# Patient Record
Sex: Male | Born: 1970 | Hispanic: Yes | Marital: Married | Smoking: Former smoker
Health system: Southern US, Community
[De-identification: ages and names within clinical notes are randomized; demographics above are authoritative.]

## PROBLEM LIST (undated history)

## (undated) DIAGNOSIS — E119 Type 2 diabetes mellitus without complications: Secondary | ICD-10-CM

## (undated) DIAGNOSIS — N289 Disorder of kidney and ureter, unspecified: Secondary | ICD-10-CM

## (undated) DIAGNOSIS — R7303 Prediabetes: Secondary | ICD-10-CM

## (undated) DIAGNOSIS — I10 Essential (primary) hypertension: Secondary | ICD-10-CM

## (undated) HISTORY — PX: HAND SURGERY: SHX662

## (undated) HISTORY — DX: Prediabetes: R73.03

## (undated) HISTORY — PX: TONSILECTOMY, ADENOIDECTOMY, BILATERAL MYRINGOTOMY AND TUBES: SHX2538

## (undated) HISTORY — PX: APPENDECTOMY: SHX54

## (undated) HISTORY — DX: Essential (primary) hypertension: I10

---

## 2009-04-19 ENCOUNTER — Inpatient Hospital Stay: Payer: Self-pay | Admitting: Surgery

## 2012-04-22 LAB — URINALYSIS, COMPLETE
Bilirubin,UR: NEGATIVE
Leukocyte Esterase: NEGATIVE
Ph: 7 (ref 4.5–8.0)
Specific Gravity: 1.009 (ref 1.003–1.030)
Squamous Epithelial: NONE SEEN

## 2012-04-22 LAB — COMPREHENSIVE METABOLIC PANEL
Albumin: 3.4 g/dL (ref 3.4–5.0)
Alkaline Phosphatase: 81 U/L (ref 50–136)
Anion Gap: 6 — ABNORMAL LOW (ref 7–16)
Bilirubin,Total: 0.3 mg/dL (ref 0.2–1.0)
Calcium, Total: 9 mg/dL (ref 8.5–10.1)
Chloride: 106 mmol/L (ref 98–107)
Co2: 31 mmol/L (ref 21–32)
Creatinine: 0.88 mg/dL (ref 0.60–1.30)
EGFR (African American): >60
Glucose: 91 mg/dL (ref 65–99)
Osmolality: 284 (ref 275–301)
Potassium: 3.7 mmol/L (ref 3.5–5.1)
Sodium: 143 mmol/L (ref 136–145)

## 2012-04-22 LAB — TSH: Thyroid Stimulating Horm: 1.82 u[IU]/mL

## 2012-04-22 LAB — TROPONIN I: Troponin-I: 0.06 ng/mL — ABNORMAL HIGH

## 2012-04-22 LAB — DRUG SCREEN, URINE
Amphetamines, Ur Screen: NEGATIVE (ref ?–1000)
Barbiturates, Ur Screen: NEGATIVE (ref ?–200)
Cannabinoid 50 Ng, Ur ~~LOC~~: NEGATIVE (ref ?–50)
Cocaine Metabolite,Ur ~~LOC~~: NEGATIVE (ref ?–300)
Methadone, Ur Screen: NEGATIVE (ref ?–300)
Opiate, Ur Screen: NEGATIVE (ref ?–300)
Phencyclidine (PCP) Ur S: NEGATIVE (ref ?–25)
Tricyclic, Ur Screen: NEGATIVE (ref ?–1000)

## 2012-04-22 LAB — CBC
HCT: 49.1 % (ref 40.0–52.0)
MCHC: 33.8 g/dL (ref 32.0–36.0)
RBC: 5.87 10*6/uL (ref 4.40–5.90)
RDW: 14.1 % (ref 11.5–14.5)
WBC: 12.1 10*3/uL — ABNORMAL HIGH (ref 3.8–10.6)

## 2012-04-22 LAB — PRO B NATRIURETIC PEPTIDE: B-Type Natriuretic Peptide: 76 pg/mL (ref 0–125)

## 2012-04-23 ENCOUNTER — Inpatient Hospital Stay: Payer: Self-pay | Admitting: Specialist

## 2012-04-23 LAB — CK TOTAL AND CKMB (NOT AT ARMC)
CK, Total: 5266 U/L — ABNORMAL HIGH (ref 35–232)
CK, Total: 3772 U/L — ABNORMAL HIGH (ref 35–232)

## 2012-04-23 LAB — TROPONIN I
Troponin-I: 0.06 ng/mL — ABNORMAL HIGH
Troponin-I: 0.07 ng/mL — ABNORMAL HIGH

## 2013-03-22 ENCOUNTER — Ambulatory Visit: Payer: Self-pay | Admitting: *Deleted

## 2013-03-25 ENCOUNTER — Ambulatory Visit (INDEPENDENT_AMBULATORY_CARE_PROVIDER_SITE_OTHER): Payer: PRIVATE HEALTH INSURANCE | Admitting: General Surgery

## 2013-03-25 ENCOUNTER — Encounter: Payer: Self-pay | Admitting: General Surgery

## 2013-03-25 VITALS — BP 136/94 | HR 78 | Resp 16 | Ht 67.0 in | Wt 291.0 lb

## 2013-03-25 DIAGNOSIS — N63 Unspecified lump in unspecified breast: Secondary | ICD-10-CM

## 2013-03-25 DIAGNOSIS — N6320 Unspecified lump in the left breast, unspecified quadrant: Secondary | ICD-10-CM

## 2013-03-25 NOTE — Progress Notes (Signed)
Patient ID: Benjamin Moran, male   DOB: 12-19-1970, 42 y.o.   MRN: [id number]  Chief Complaint  Patient presents with  . Breast Problem    HPI Benjamin Moran is a 42 y.o. male.  who presents for a breast evaluation. States left breast pain for about 1 month. States he can feel " a ball" for about 3 months. Denies any specific breast trauma or injury but he does lift boxes at his job.  Interpreter [name] present at visit. HPI  Past Medical History  Diagnosis Date  . Borderline diabetic   . Hypertension     Past Surgical History  Procedure Laterality Date  . Appendectomy      Family History  Problem Relation Age of Onset  . Breast cancer Cousin     Social History History  Substance Use Topics  . Smoking status: Former Smoker    Quit date: 08/19/2006  . Smokeless tobacco: Not on file  . Alcohol Use: No    Allergies  Allergen Reactions  . Penicillins     Current Outpatient Prescriptions  Medication Sig Dispense Refill  . HYDROCHLOROTHIAZIDE PO Take 12.5 mg by mouth daily.       Marland Kitchen LISINOPRIL PO Take 2.5 mg by mouth daily.        No current facility-administered medications for this visit.    Review of Systems Review of Systems  Constitutional: Negative.   Respiratory: Negative.   Cardiovascular: Negative.     Blood pressure 136/94, pulse 78, resp. rate 16, height 5\' 7"  (1.702 m), weight 291 lb (131.997 kg).  Physical Exam Physical Exam  Constitutional: He is oriented to person, place, and time. He appears well-developed and well-nourished.  Eyes: Conjunctivae are normal.  Neck: Neck supple. No thyromegaly present.  Cardiovascular: Normal rate and regular rhythm.   Pulmonary/Chest: Effort normal and breath sounds normal. Right breast exhibits no inverted nipple, no mass, no nipple discharge, no skin change and no tenderness. Left breast exhibits tenderness (lateral aspect). Left breast exhibits no inverted nipple, no mass, no nipple discharge and no  skin change.  Lymphadenopathy:    He has no cervical adenopathy.    He has no axillary adenopathy.  Neurological: He is alert and oriented to person, place, and time.  Skin: Skin is warm and dry.    Data Reviewed Mammogram and ultrasound reviewed.  Assessment    Likely a benign process such as fat necrosis or gynecomastia.  Feel it can be followed at this time.    Plan    Follow up with a left breast office ultrasound in one month.       SANKAR,SEEPLAPUTHUR G 03/25/2013, 7:08 PM

## 2013-03-25 NOTE — Patient Instructions (Addendum)
Autoexamen de mamas   (Breast Self-Awareness)  El autoexamen de mama le permite advertir problemas en la mama con anticipación, cuando todavía son pequeños. Haga un autoexamen de las mamas:   · Todos los meses, 5 a 7 días después de tener el período (menstruación).  · En la misma altura de cada mes si ya no tiene más su período.  Busque:  · Diferencias entre los senos (tamaño, forma o posición).  · Cambios en el tamaño o forma del seno.  · Sale sangre o líquido por los pezones.  · Modificaciones en los pezones (hoyuelos, movimiento del pezón).  ·  Cambio en el color o la textura de la piel (enrojecimiento, áreas escamosas).  Trate de palpar:   · Engrosamientos.  · Bultos.  · Hoyuelos.  · Cualquier otro cambio.  CÓMO REALIZAR EL AUTOEXAMEN DE MAMAS   Observe sus senos y pezones.   1. Quítese toda la ropa por encima de la cintura.  2. Párese frente a un espejo en una habitación con buena iluminación.  3.  Ponga las manos en las caderas y empuje las manos hacia abajo.  Palpe las mamas.   1. Acuéstese boca arriba o de pie en la ducha o la bañera. Si está en la ducha o bañera, moje y enjabone sus manos.  2. Coloque el brazo derecho por encima de la cabeza.  3. Coloque la mano izquierda en el área de la axila derecha.  4. Haga pequeños círculos con las yemas de los dedos (no las puntas) de los 3 dedos medios. Presione ligeramente y luego haga una presión mediana y firme.  5. Mueva los dedos un poco más bajo y haga pequeños círculos en 3 presiones (ligera, media y firme).  6. Siga moviendo los dedos hacia abajo y haciendo círculos hasta llegar a la parte inferior de la mama.  7. Mueva sus dedos de a un dedo de ancho hacia el centro de su cuerpo.  8. Continúe haciendo círculos, esta vez hacia arriba hasta llegar a la parte inferior del cuello.  9. Mueva sus dedos de a un dedo de ancho hacia el centro de su cuerpo.  10. Haga círculos hacia abajo a partir de la parte inferior del cuello. Haga círculos hacia arriba a partir de  la parte inferior de la mama. Deténgase cuando llegue a la mitad del pecho.  11.  Repita estos pasos en la otra mama.  Escriba lo que observe y se sienta normal para cada mama. También anote cualquier cambio que note.   SOLICITE AYUDA DE INMEDIATO SI:   · Observa cambios en las mamas o los pezones.  · Observa cambios en la piel.  · Tiene una secreción anormal en los pezones.  · Usted tiene un bulto nuevo.  · Tiene zonas con endurecimientos inusuales.  Document Released: 09/07/2010 Document Revised: 07/22/2012  [employer]® Patient Information ©2014 [employer].

## 2013-04-22 ENCOUNTER — Ambulatory Visit: Payer: PRIVATE HEALTH INSURANCE | Admitting: General Surgery

## 2013-04-29 ENCOUNTER — Encounter: Payer: Self-pay | Admitting: *Deleted

## 2013-07-22 ENCOUNTER — Ambulatory Visit (INDEPENDENT_AMBULATORY_CARE_PROVIDER_SITE_OTHER): Payer: PRIVATE HEALTH INSURANCE | Admitting: General Surgery

## 2013-07-22 ENCOUNTER — Encounter: Payer: Self-pay | Admitting: General Surgery

## 2013-07-22 ENCOUNTER — Ambulatory Visit: Payer: PRIVATE HEALTH INSURANCE

## 2013-07-22 VITALS — BP 150/72 | HR 78 | Resp 18 | Ht 67.0 in | Wt 291.0 lb

## 2013-07-22 DIAGNOSIS — N6320 Unspecified lump in the left breast, unspecified quadrant: Secondary | ICD-10-CM

## 2013-07-22 DIAGNOSIS — N63 Unspecified lump in unspecified breast: Secondary | ICD-10-CM

## 2013-07-22 NOTE — Progress Notes (Signed)
Patient ID: Benjamin Moran, male   DOB: 07/21/1971, 42 y.o.   MRN: [id number]  Chief Complaint  Patient presents with  . Follow-up    left breast ultrasound    HPI Benjamin Moran is a 42 y.o. male here today for a left breast ultrasound. Patient states still feels the lump in the left breast but the pain is less than before on a scale of 1-10 the pain is a 2.   HPI  Past Medical History  Diagnosis Date  . Borderline diabetic   . Hypertension     Past Surgical History  Procedure Laterality Date  . Appendectomy      Family History  Problem Relation Age of Onset  . Breast cancer Cousin     Social History History  Substance Use Topics  . Smoking status: Former Smoker    Quit date: 08/19/2006  . Smokeless tobacco: Never Used  . Alcohol Use: No    Allergies  Allergen Reactions  . Penicillins     Current Outpatient Prescriptions  Medication Sig Dispense Refill  . HYDROCHLOROTHIAZIDE PO Take 12.5 mg by mouth daily.       Marland Kitchen LISINOPRIL PO Take 2.5 mg by mouth daily.        No current facility-administered medications for this visit.    Review of Systems Review of Systems  Constitutional: Negative.   Respiratory: Negative.   Cardiovascular: Negative.     Blood pressure 150/72, pulse 78, resp. rate 18, height 5\' 7"  (1.702 m), weight 291 lb (131.997 kg).  Physical Exam Physical Exam  Constitutional: He is oriented to person, place, and time. He appears well-developed and well-nourished.  Eyes: No scleral icterus.  Pulmonary/Chest: Right breast exhibits no inverted nipple, no mass, no nipple discharge, no skin change and no tenderness. Left breast exhibits no inverted nipple, no nipple discharge, no skin change and no tenderness.  Lymphadenopathy:    He has no cervical adenopathy.    He has no axillary adenopathy.  Neurological: He is alert and oriented to person, place, and time.  Skin: Skin is warm and dry.    Data Reviewed Reviewed last  ultrasound  Assessment left breast ultrasound today shows some mildly dilated ducts at 3 o'cl left subareoalar,the area measuring 1.55 cm-smaller than it was before.    Likely gynecomastia, improving. Pt advised.  Plan   Patient to return in three months for recheck     Interpreter present for interview , exam and discussion   Vaani Morren G 07/22/2013, 10:23 AM

## 2013-07-22 NOTE — Patient Instructions (Addendum)
Patient to return in three months. Call for any problems in the interval.

## 2013-10-28 ENCOUNTER — Encounter: Payer: Self-pay | Admitting: General Surgery

## 2013-10-28 ENCOUNTER — Ambulatory Visit (INDEPENDENT_AMBULATORY_CARE_PROVIDER_SITE_OTHER): Payer: PRIVATE HEALTH INSURANCE | Admitting: General Surgery

## 2013-10-28 VITALS — BP 150/100 | HR 74 | Resp 16 | Ht 67.0 in | Wt 296.0 lb

## 2013-10-28 DIAGNOSIS — N62 Hypertrophy of breast: Secondary | ICD-10-CM

## 2013-10-28 NOTE — Patient Instructions (Addendum)
Patient to return as needed. Call office for any new breast issues or concerns.

## 2013-10-28 NOTE — Progress Notes (Signed)
Patient ID: Benjamin Moran, male   DOB: 03/20/1971, 43 y.o.   MRN: [id number]  Chief Complaint  Patient presents with  . Follow-up    gynecomastia    HPI Benjamin Moran is a 43 y.o. male here today following with gynecomastia. Patient states he is doing good no pain in left breast area. HPI  Past Medical History  Diagnosis Date  . Borderline diabetic   . Hypertension     Past Surgical History  Procedure Laterality Date  . Appendectomy      Family History  Problem Relation Age of Onset  . Breast cancer Cousin     Social History History  Substance Use Topics  . Smoking status: Former Smoker    Quit date: 08/19/2006  . Smokeless tobacco: Never Used  . Alcohol Use: No    Allergies  Allergen Reactions  . Penicillins     No current outpatient prescriptions on file.   No current facility-administered medications for this visit.    Review of Systems Review of Systems  Constitutional: Negative.   Respiratory: Negative.   Cardiovascular: Negative.     Blood pressure 150/100, pulse 74, resp. rate 16, height 5\' 7"  (1.702 m), weight 296 lb (134.265 kg).  Physical Exam Physical Exam  Constitutional: He is oriented to person, place, and time. He appears well-developed and well-nourished.  Eyes: Conjunctivae are normal.  Neck: Neck supple.  Pulmonary/Chest: Right breast exhibits no inverted nipple, no mass, no nipple discharge, no skin change and no tenderness. Left breast exhibits no inverted nipple, no mass, no nipple discharge, no skin change and no tenderness.  Lymphadenopathy:    He has no cervical adenopathy.    He has no axillary adenopathy.  Neurological: He is alert and oriented to person, place, and time.  Skin: Skin is warm and dry.    Data Reviewed None  Assessment   Gynecomastia resolved     Plan    Patient to return as needed.         SANKAR,SEEPLAPUTHUR G 10/28/2013, 10:54 AM

## 2013-12-24 DIAGNOSIS — S62109A Fracture of unspecified carpal bone, unspecified wrist, initial encounter for closed fracture: Secondary | ICD-10-CM | POA: Insufficient documentation

## 2014-01-07 DIAGNOSIS — M25539 Pain in unspecified wrist: Secondary | ICD-10-CM | POA: Insufficient documentation

## 2014-02-28 DIAGNOSIS — S63599A Other specified sprain of unspecified wrist, initial encounter: Secondary | ICD-10-CM | POA: Insufficient documentation

## 2014-12-06 NOTE — Discharge Summary (Signed)
PATIENT NAME:  Benjamin LionsCRUZ CORTES, Demari MR#:  [id number] DATE OF BIRTH:  1971-07-03  DATE OF ADMISSION:  04/23/2012 DATE OF DISCHARGE:  04/23/2012  For a detailed note, please see the history and physical done by Dr. Tilda FrancoAkuneme on admission.   DIAGNOSES AT DISCHARGE:  1. Hypertension, newly diagnosed.  2. Shortness of breath due to congestive heart failure.  3. Congestive heart failure, likely diastolic dysfunction.  4. Obesity.   DIET: The patient is being discharged on a low sodium diet.   ACTIVITY: As tolerated.   FOLLOW-UP: [facility] and also with Dr. Arnoldo HookerBruce Kowalski in the next 1 to 2 weeks.    DISCHARGE MEDICATIONS:  1. Coreg 6.25 mg b.i.d.  2. Lisinopril 10 mg daily.  3. Lasix 20 mg daily.  4. Potassium 10 mEq daily.   CONSULTANT DURING THE HOSPITAL COURSE: Dr. Arnoldo HookerBruce Kowalski from Cardiology    PERTINENT STUDIES DONE DURING THE HOSPITAL COURSE:  1. Chest x-ray done on admission showing no acute cardiopulmonary disease.  2. An ultrasound of the abdomen showing gallstones without evidence of acute cholecystitis. 3. A two-dimensional echocardiogram done showing normal LV function.  4. A treadmill exercise stress test done showing no evidence of any acute cardiac ischemia. No EKG changes.   HOSPITAL COURSE: This is a 44 year old male who presented to the hospital with progressive shortness of breath and chest pressure.  1. Chest pain/pressure. The patient did have slightly elevated troponin at 0.06. They were cycled but they did not trend upwards. He had no further episodes of chest pain or pressure. As per Cardiology, the troponin elevation was probably related to demand ischemia from his congestive heart failure and unlikely acute coronary syndrome. He did have an echocardiogram done which showed normal LV function.  2. Shortness of breath. This was likely secondary to congestive heart failure. The patient did have uncontrolled hypertension and was not  taking any meds. He did have significant lower extremity edema, therefore, this is likely diastolic dysfunction secondary to his uncontrolled hypertension. He was diuresed with Lasix. His clinical symptoms have improved. He is being discharged on p.o. diuretics along with beta-blockers and ACE inhibitors as stated. He will have close follow-up with Cardiology as outpatient.  3. Hypertension. As mentioned, the patient did not have any previous history of hypertension prior to coming in. His blood pressures were definitely uncontrolled. Currently he is being discharged on Coreg and lisinopril as stated.   CODE STATUS: The patient is a FULL CODE.   TIME SPENT: 40 minutes.   ____________________________ Rolly PancakeVivek J. Cherlynn KaiserSainani, MD vjs:drc D: 04/23/2012 16:49:49 ET T: 04/24/2012 13:56:23 ET JOB#: [id number]  cc: Rolly PancakeVivek J. Cherlynn KaiserSainani, MD, <Dictator> [facility] Lamar BlinksBruce J. Kowalski, MD [place] SirenVIVEK J SAINANI MD ELECTRONICALLY SIGNED 04/27/2012 13:24

## 2014-12-06 NOTE — H&P (Signed)
PATIENT NAME:  Benjamin Moran, Benjamin Moran MR#:  [id number] DATE OF BIRTH:  08-Jan-1971  DATE OF ADMISSION:  04/23/2012  PRIMARY CARE PHYSICIAN: None. ER PHYSICIAN: Dr. Cyril LoosenKinner ADMITTING PHYSICIAN: Dr. Tilda FrancoAkuneme    PRESENTING COMPLAINT: Shortness of breath and chest pain over the last two weeks.   HISTORY: Patient is Spanish speaking only and an interpreter, Rande Lawmanrwin, was used for history and physical. Patient is a 44 year old male who was in his usual state of health until about two weeks ago when he started having progressive shortness of breath. This was initially exertional with intermittent chest pain. Denies any fever. No loss of consciousness. Admits to PND, orthopnea, pedal edema, decreased appetite, increased satiety, increased weight gain. Family stated he has gained about 25 pounds in the last one or two months. Admits to increased urinary frequency but no polyuria or dysuria. With increasing fatigue presented to the Emergency Room today after episode of chest pain this evening. Pain was dull in nature like heaviness on his chest, intensity 8/10, associated weakness and dizziness for this he presented to the Emergency Room, received nitroglycerin and aspirin and had symptoms resolved. Workup so far included a chest x-ray which showed elevated hemidiaphragm, pulmonary vascular congestion. Gallbladder ultrasound which showed gallstones and fatty liver but no biliary duct obstruction. Elevated troponin of 0.06 and elevated blood pressure with systolic 192 on arrival. For this he was referred to the hospitalist for further evaluation. Patient denies any history of long distance travel, sick contact. No change in medication, has not been on any medication in the last three years. No prior medical history of concern.    REVIEW OF SYSTEMS: CONSTITUTIONAL: Positive for fatigue, weakness, and weight gain. EYES: Positive for blurred vision. No cataracts or discharge. No redness. ENT: No tinnitus, epistaxis, or  difficulty swallowing. RESPIRATORY: Positive for cough which is nonproductive and shortness of breath. CARDIOVASCULAR: Positive for chest pain which is now resolved. Intermittent palpitations, exertional dyspnea, PND, orthopnea, pedal edema. GASTROINTESTINAL: Positive for increased satiety but no nausea, vomiting, diarrhea, or change in bowel habits. GENITOURINARY: Positive for frequency but no incontinence, hematuria, or dysuria. ENDOCRINE: Denies any heat or cold intolerance, excessive thirst or increased sweating. HEMATOLOGIC: No anemia, easy bruising, bleeding, or swollen glands. SKIN: No rashes, change in hair or skin texture. MUSCULOSKELETAL: No joint pain, redness, or limited activity. NEURO: No numbness, tremors, vertigo, seizures, memory loss. PSYCH: No anxiety, depression, nervousness.   PAST MEDICAL HISTORY: Nil of note.   PAST SURGICAL HISTORY: Appendectomy about two years ago.   SOCIAL HISTORY: He is married, lives at home with his wife and children. No alcohol, tobacco, or recreational drug use. Works as a Estate agentforklift operator.   FAMILY HISTORY: Positive for cholecystitis. Denies any family history of diabetes, hypertension, strokes or coronary artery disease.   ALLERGIES: Penicillin gives him rash and occasional shortness of breath.   MEDICATIONS: None.   PHYSICAL EXAMINATION:  VITAL SIGNS: Temperature 98.2, pulse 84, respiratory rate 18, blood pressure on arrival 156/77 with peak of 192/84 here in the Emergency Room, currently is down to 152/76, oxygen saturation 96 on room air.   GENERAL: Spanish speaking male overweight, lying on the gurney, awake, alert, oriented to time, place, and person, no family at bedside, supportive.   HEENT: Atraumatic, normocephalic. Pupils equal, reactive to light and accommodation. Mucous membranes pink, moist.   NECK: Supple. No JV distention.   CHEST: Good air entry. Few transmitted breath sounds. No rhonchi, no rales.   HEART: Regular rate and  rhythm. No murmurs.   ABDOMEN: Obese, pendulous, moves with respiration, nontender. Bowel sounds normoactive. No organomegaly.   EXTREMITIES: Bilateral pitting leg edema 2+.   NEUROLOGICAL: No focal motor or sensory deficits. Affect appropriate to situation.   LABORATORY, DIAGNOSTIC AND RADIOLOGICAL DATA: EKG showed normal sinus rhythm, left atrial enlargement, heart rate 79. No acute ST wave changes. Chest x-ray showed elevated hemidiaphragm, pulmonary vascular congestion but no infiltrate. Gallbladder ultrasound showed gallstones but no biliary duct obstruction or that patient had fatty liver.   CBC: White count 12, hemoglobin 16, platelets 313. No prior labs for comparison. Chemistry unremarkable. Sodium 143, potassium 3.7, creatinine 0.8, glucose 91, calcium 9, anion gap 6, AST elevated at 169. Troponin is elevated at 0.06. TSH 1.8. Urinalysis is negative.   IMPRESSION:  1. Chest pain, to rule out ACS versus arrhythmias, also possibly secondary to elevated blood pressure.  2. Elevated blood pressure with no prior history of hypertension, likely accelerated hypertension.  3. Leukocytosis, not otherwise specified.  4. Gallstone, not otherwise specified.   PLAN:  1. Admit to telemetry floor. Check PT- INR, BNP, serial cardiac enzymes, fasting lipid profile, magnesium.  2. Trend WBC. At this time will avoid cultures and antibiotics as no obvious signs of infection.  3. Telemonitoring. Echocardiogram in a.m.  4. Aspirin, p.r.n. nitroglycerin and morphine. Lovenox weight-based dose. Cardiology evaluation in a.m. Daily weight. IV diuresis with Lasix, low dose beta blocker, Coreg 6.25 mg. Will introduce ACE inhibitor later once blood pressure tolerated beta blocker.  5. GI prophylaxis with Protonix. Deep vein thrombosis prophylaxis with Lovenox.  6. CODE STATUS: FULL CODE.  7. Stress test prior to discharge or within the next six weeks.   TOTAL PATIENT CARE TIME: 50 minutes.    ____________________________ Floy Sabina Tilda Franco, MD mia:cms D: 04/23/2012 00:26:44 ET T: 04/23/2012 07:29:07 ET  JOB#: [id number] cc: Antionetta Ator I. Tilda Franco, MD, <Dictator> Margaret Pyle MD ELECTRONICALLY SIGNED 04/24/2012 0:51

## 2014-12-06 NOTE — Consult Note (Signed)
PATIENT NAME:  Benjamin Moran, Benjamin Moran MR#:  [id number] DATE OF BIRTH:  07/11/1971  DATE OF CONSULTATION:  04/23/2012  REFERRING PHYSICIAN:  Dr. Tilda Franco  CONSULTING PHYSICIAN:  Lamar Blinks, MD  REASON FOR CONSULTATION: Chest discomfort, tobacco abuse with borderline hypertension, hyperlipidemia needing further treatment options.   CHIEF COMPLAINT: "I had chest pain."   HISTORY OF PRESENT ILLNESS: This is a 44 year old male with known borderline hypertension, hyperlipidemia with tobacco abuse who has had new onset of waxing and waning substernal chest discomfort and burning and pressure. This is occurring in the last several days with accumulation of lower extremity edema and shortness of breath consistent with possible congestive heart failure. The patient was seen in the Emergency Room with an EKG showing normal sinus rhythm with nonspecific ST changes. Troponin, CK-MB are within normal limits and the patient has had some slight improvement of symptoms over the last several hours. The patient has had no evidence of nausea or vomiting at this time.   REVIEW OF SYSTEMS: Remainder review of systems negative for vision change, ringing in the ears, hearing loss, cough, congestion, heartburn, nausea, vomiting, diarrhea, bloody stools, stomach pain, extremity pain, leg weakness, cramping of the buttocks, known blood clots, headaches, blackouts, dizzy spells, nosebleeds, congestion, trouble swallowing, frequent urination, urination at night, muscle weakness, numbness, anxiety, depression, skin lesions, skin rashes.   PAST MEDICAL HISTORY:  1. Borderline hypertension.  2. Borderline hyperlipidemia.   FAMILY HISTORY: No family members with early onset of cardiovascular disease or hypertension.   SOCIAL HISTORY: He currently denies alcohol use, occasionally having tobacco use.  ALLERGIES: He has no apparent known allergies.   CURRENT MEDICATIONS: As listed have.   PHYSICAL EXAMINATION:  VITAL  SIGNS: Blood pressure 136/68 bilaterally, heart rate 72 upright, reclining, and regular.   GENERAL: He is a well appearing male in no acute distress.   HEENT: No icterus, thyromegaly, ulcers, hemorrhage, or xanthelasma.   CARDIOVASCULAR: Regular rate and rhythm. Normal S1 and S2. Diffuse PMI. No apparent murmur, gallop, or rub. Carotid upstroke normal without bruit. Jugular venous pressure normal.   LUNGS: Lungs have few basilar crackles with normal respirations.   ABDOMEN: Soft, nontender without hepatosplenomegaly or masses. Abdominal aorta is normal size without bruit.   EXTREMITIES: 2+ bilateral pulses in dorsal, pedal, radial, and femoral arteries 1+ lower extremity edema. No cyanosis, clubbing, ulcers.   NEUROLOGIC: He is oriented to time, place, and person with normal mood and affect.   ASSESSMENT: 44 year old male with acute onset of chest burning, shortness of breath consistent with possible congestive heart failure with no current evidence of myocardial infarction with abnormal EKG needing further treatment options.   RECOMMENDATIONS:  1. Continue serial ECG and enzymes to assess for possible extent of myocardial infarction.  2. Echocardiogram for LV systolic dysfunction, valvular heart disease contributing to current symptoms and possible heart failure.  3. Follow hypertension and hyperlipidemia and further treatment options with possible ACE inhibitor versus beta blocker depending on echocardiogram.        4. Consideration of cardiac catheterization versus stress test to assess for possible myocardial ischemia, coronary artery disease contributing to above. Patient understands the risks and benefits of cardiac catheterization. These include the possibly of death, stroke, heart attack, infection, bleeding, blood clot. He is at low risk for conscious sedation.   ____________________________ Lamar Blinks, MD bjk:cms D: 04/23/2012 05:33:44 ET T: 04/23/2012 10:26:13  ET JOB#: [id number]  cc: Lamar Blinks, MD, <Dictator> Lamar Blinks MD ELECTRONICALLY  SIGNED 04/24/2012 7:50

## 2019-11-05 ENCOUNTER — Ambulatory Visit: Payer: Self-pay | Attending: Internal Medicine

## 2019-11-05 DIAGNOSIS — Z23 Encounter for immunization: Secondary | ICD-10-CM

## 2019-11-05 NOTE — Progress Notes (Signed)
   Covid-19 Vaccination Clinic  Name:  Benjamin Moran    MRN: [id number] DOB: 1971/07/22  11/05/2019  [name] was observed post Covid-19 immunization for 15 minutes without incident. He was provided with Vaccine Information Sheet and instruction to access the V-Safe system.   [name] was instructed to call 911 with any severe reactions post vaccine: Marland Kitchen Difficulty breathing  . Swelling of face and throat  . A fast heartbeat  . A bad rash all over body  . Dizziness and weakness   Immunizations Administered    Name Date Dose VIS Date Route   Pfizer COVID-19 Vaccine 11/05/2019 12:33 PM 0.3 mL 07/30/2019 Intramuscular   Manufacturer: [employer], [employer]   Lot: [id number]   NDC: [address]-0

## 2019-11-26 ENCOUNTER — Ambulatory Visit: Payer: Self-pay | Attending: Internal Medicine

## 2019-11-26 DIAGNOSIS — Z23 Encounter for immunization: Secondary | ICD-10-CM

## 2019-11-26 NOTE — Progress Notes (Signed)
   Covid-19 Vaccination Clinic  Name:  Benjamin Moran    MRN: [id number] DOB: December 04, 1970  11/26/2019  [name] was observed post Covid-19 immunization for 15 minutes without incident. He was provided with Vaccine Information Sheet and instruction to access the V-Safe system.   [name] was instructed to call 911 with any severe reactions post vaccine: Marland Kitchen Difficulty breathing  . Swelling of face and throat  . A fast heartbeat  . A bad rash all over body  . Dizziness and weakness   Immunizations Administered    Name Date Dose VIS Date Route   Pfizer COVID-19 Vaccine 11/26/2019 12:30 PM 0.3 mL 07/30/2019 Intramuscular   Manufacturer: [employer], [employer]   Lot: [id number]   NDC: [address]-9

## 2020-05-27 ENCOUNTER — Encounter: Payer: Self-pay | Admitting: Emergency Medicine

## 2020-05-27 ENCOUNTER — Other Ambulatory Visit: Payer: Self-pay

## 2020-05-27 ENCOUNTER — Emergency Department: Payer: Self-pay

## 2020-05-27 ENCOUNTER — Emergency Department
Admission: EM | Admit: 2020-05-27 | Discharge: 2020-06-03 | Disposition: A | Discharge status: Home / Self Care | Payer: Self-pay | Attending: Emergency Medicine | Admitting: Emergency Medicine

## 2020-05-27 DIAGNOSIS — R2243 Localized swelling, mass and lump, lower limb, bilateral: Secondary | ICD-10-CM | POA: Insufficient documentation

## 2020-05-27 DIAGNOSIS — E119 Type 2 diabetes mellitus without complications: Secondary | ICD-10-CM | POA: Insufficient documentation

## 2020-05-27 DIAGNOSIS — I10 Essential (primary) hypertension: Secondary | ICD-10-CM | POA: Insufficient documentation

## 2020-05-27 DIAGNOSIS — Z87891 Personal history of nicotine dependence: Secondary | ICD-10-CM | POA: Insufficient documentation

## 2020-05-27 DIAGNOSIS — R609 Edema, unspecified: Secondary | ICD-10-CM

## 2020-05-27 HISTORY — DX: Type 2 diabetes mellitus without complications: E11.9

## 2020-05-27 LAB — CBC
HCT: 49.1 % (ref 39.0–52.0)
Hemoglobin: 15.6 g/dL (ref 13.0–17.0)
MCH: 27.4 pg (ref 26.0–34.0)
MCHC: 31.8 g/dL (ref 30.0–36.0)
MCV: 86.1 fL (ref 80.0–100.0)
Platelets: 357 10*3/uL (ref 150–400)
RBC: 5.7 MIL/uL (ref 4.22–5.81)
RDW: 13.9 % (ref 11.5–15.5)
WBC: 12.9 10*3/uL — ABNORMAL HIGH (ref 4.0–10.5)
nRBC: 0 % (ref 0.0–0.2)

## 2020-05-27 LAB — BASIC METABOLIC PANEL
Anion gap: 10 (ref 5–15)
BUN: 15 mg/dL (ref 6–20)
CO2: 33 mmol/L — ABNORMAL HIGH (ref 22–32)
Calcium: 8.9 mg/dL (ref 8.9–10.3)
Chloride: 92 mmol/L — ABNORMAL LOW (ref 98–111)
Creatinine, Ser: 0.99 mg/dL (ref 0.61–1.24)
GFR, Estimated: >60 mL/min (ref >60)
Glucose, Bld: 114 mg/dL — ABNORMAL HIGH (ref 70–99)
Potassium: 4.4 mmol/L (ref 3.5–5.1)
Sodium: 135 mmol/L (ref 135–145)

## 2020-05-27 LAB — BRAIN NATRIURETIC PEPTIDE: B Natriuretic Peptide: 54.3 pg/mL (ref 0.0–100.0)

## 2020-05-27 LAB — TROPONIN I (HIGH SENSITIVITY): Troponin I (High Sensitivity): 8 ng/L (ref <18)

## 2020-05-27 MED ORDER — FUROSEMIDE 40 MG PO TABS: 40.0000 mg | ORAL_TABLET | Freq: Two times a day (BID) | ORAL | 1 refills | Status: DC

## 2020-05-27 NOTE — ED Notes (Signed)
Pt reports at 9am he took 2 of a friends 20mg  Lasix and then at 2pm took #2 40mg  lasix of his own.

## 2020-05-27 NOTE — ED Provider Notes (Signed)
[facility] Emergency Department Provider Note   ____________________________________________   I have reviewed the triage vital signs and the nursing notes.   HISTORY  Chief Complaint Leg swelling  History limited by: Language [facility] Interpreter utilized   HPI [facility] Benjamin Moran is a 49 y.o. male who presents to the emergency department today because of concerns for leg swelling.  He states he noticed some swelling 4 days ago.  Located in both legs.  There is no specific pain although he feels like there about burst.  He states that the swelling started shortly after he started taking over-the-counter magnesium supplements.  He thinks that this might have something to do with the swelling.  Additionally the patient thinks it might have to do with the latest prescription for Lasix that he received.  He felt like the pills even though they were of a higher dosage were not causing him to urinate as much. He did try taking a friends 20mg  prescription (2 tablets) and felt that it worked much better. He is concerned that the prescription he picked up is not working. He denies any chest pain or shortness of breath to myself.    Records reviewed. Per medical record review patient has a history of DM, HTN  Past Medical History:  Diagnosis Date  . Borderline diabetic   . Diabetes mellitus without complication (HCC)   . Hypertension     There are no problems to display for this patient.   Past Surgical History:  Procedure Laterality Date  . APPENDECTOMY      Prior to Admission medications   Not on File    Allergies Penicillins  Family History  Problem Relation Age of Onset  . Breast cancer Cousin     Social History Social History   Tobacco Use  . Smoking status: Former Smoker    Quit date: 08/19/2006    Years since quitting: 13.7  . Smokeless tobacco: Never Used  Substance Use Topics  . Alcohol use: No  . Drug use: No     Review of Systems Constitutional: No fever/chills Eyes: No visual changes. ENT: No sore throat. Cardiovascular: Denies chest pain. Respiratory: Denies shortness of breath. Gastrointestinal: No abdominal pain.  No nausea, no vomiting.  No diarrhea.   Genitourinary: Negative for dysuria. Musculoskeletal: Positive for bilateral leg swelling.  Skin: Negative for rash. Neurological: Negative for headaches, focal weakness or numbness.  ____________________________________________   PHYSICAL EXAM:  VITAL SIGNS: ED Triage Vitals  Enc Vitals Group     BP 05/27/20 1455 (!) 117/56     Pulse Rate 05/27/20 1455 71     Resp 05/27/20 1455 20     Temp 05/27/20 1455 98.3 F (36.8 C)     Temp Source 05/27/20 1455 Oral     SpO2 05/27/20 1455 (!) 88 %     Weight --      Height 05/27/20 1510 5' 9.69" (1.77 m)     Head Circumference --      Peak Flow --      Pain Score 05/27/20 1507 7   Constitutional: Alert and oriented.  Eyes: Conjunctivae are normal.  ENT      Head: Normocephalic and atraumatic.      Nose: No congestion/rhinnorhea.      Mouth/Throat: Mucous membranes are moist.      Neck: No stridor. Hematological/Lymphatic/Immunilogical: No cervical lymphadenopathy. Cardiovascular: Normal rate, regular rhythm.  No murmurs, rubs, or gallops. Respiratory: Normal respiratory effort without tachypnea  nor retractions. Breath sounds are clear and equal bilaterally. No wheezes/rales/rhonchi. Gastrointestinal: Soft and non tender. No rebound. No guarding.  Genitourinary: Deferred Musculoskeletal: Normal range of motion in all extremities. 2+ bilateral edema.  Neurologic:  Normal speech and language. No gross focal neurologic deficits are appreciated.  Skin:  Skin is warm, dry and intact. No rash noted. Psychiatric: Mood and affect are normal. Speech and behavior are normal. Patient exhibits appropriate insight and judgment.  ____________________________________________    LABS  (pertinent positives/negatives)  Trop hs 8 BNP 54.3 CBC wbc 12.9, hgb 15.6, plt 357 BMP na 135, k 4.4, cl 92, glu 114, cr 0.99  ____________________________________________   EKG  I, Phineas Semen, attending physician, personally viewed and interpreted this EKG  EKG Time: 1514 Rate: 75 Rhythm: normal sinus rhythm Axis: normal Intervals: qtc 428 QRS: narrow ST changes: no st elevation Impression: normal ekg  ____________________________________________    RADIOLOGY  CXR Low lung volumes with bronchovascular crowding.   ____________________________________________   PROCEDURES  Procedures  ____________________________________________   INITIAL IMPRESSION / ASSESSMENT AND PLAN / ED COURSE  Pertinent labs & imaging results that were available during my care of the patient were reviewed by me and considered in my medical decision making (see chart for details).   Patient presented to the emergency department today because of concerns for bilateral peripheral edema.  On exam patient does have 2+ swelling bilaterally.  No erythema.  At this time I doubt PE.  Do think it could be related to fluid overload.  Discussed this with the patient.  Will give patient new prescription for his home Lasix.  Also give patient heart failure clinic information.  ____________________________________________   FINAL CLINICAL IMPRESSION(S) / ED DIAGNOSES  Final diagnoses:  Peripheral edema     Note: This dictation was prepared with Dragon dictation. Any transcriptional errors that result from this process are unintentional     Phineas Semen, MD 05/27/20 1757

## 2020-05-27 NOTE — Discharge Instructions (Addendum)
Please seek medical attention for any high fevers, chest pain, shortness of breath, change in behavior, persistent vomiting, bloody stool or any other new or concerning symptoms.  

## 2020-05-27 NOTE — ED Triage Notes (Addendum)
Pt arrived via POV from home with c/o lower leg swelling x 4 days, pt states he took lasix 40mg  at home the past few days. Pt reports despite taking the lasix, he has not been urinating as much. Pt states he took a friends pill of furosemide and started urinating more and is concerned he did not get the right medication from the pharmacy.   Pt also c/o retaining fluid in stomach and feels some pressure in his chest as well.   Pt states the leg swelling is worse on the L than R, both have 1-2+ pitting edema.   Pt able to speak in complete sentences without running out of breath.  Triage assessment completed via Face-to-Face Spanish interpreter [place].

## 2020-06-07 NOTE — Progress Notes (Signed)
Patient ID: Benjamin Moran, male    DOB: Jun 11, 1971, 49 y.o.   MRN: [id number]  [facility] interpreter present during the entire visit  HPI  Benjamin Moran is a 49 y/o male with a history of DM, HTN and previous tobacco use.   No echo report.  Was in the ED 05/27/20 due to leg swelling that was present for the last 4 days. Evaluated and released.   He presents today for his initial visit with a chief complaint of minimal shortness of breath upon moderate exertion. He describes this as chronic in nature having been present for several months. He has associated fatigue, pedal edema (improving), snoring and apneic episodes along with this. He denies any difficulty sleeping, abdominal distention, palpitations, chest pain, dizziness or cough.   Does have scales but says that he hasn't been weighing himself daily because he knows his weight is climbing. He was hoping to get gastric bypass surgery but the surgeon won't schedule it until he has medicaid in place and he says that he can't get medicaid until he has a surgery date.   Wife says that he snores loudly where she can hear him through the door and that he does stop breathing during his sleep. Patient says that he was supposed to have a sleep study "sometime" but he's not clear about what happened with that.   Past Medical History:  Diagnosis Date  . Borderline diabetic   . Diabetes mellitus without complication (HCC)   . Hypertension    Past Surgical History:  Procedure Laterality Date  . APPENDECTOMY     Family History  Problem Relation Age of Onset  . Breast cancer Cousin    Social History   Tobacco Use  . Smoking status: Former Smoker    Quit date: 08/19/2006    Years since quitting: 13.8  . Smokeless tobacco: Never Used  Substance Use Topics  . Alcohol use: No   Allergies  Allergen Reactions  . Penicillins Hives    Other reaction(s): Unknown   Prior to Admission medications   Medication Sig Start Date  End Date Taking? Authorizing Provider  atorvastatin (LIPITOR) 10 MG tablet Take 10 mg by mouth daily.   Yes [provider]  carvedilol (COREG) 3.125 MG tablet Take 3.125 mg by mouth 2 (two) times daily with a meal.   Yes [provider]  furosemide (LASIX) 40 MG tablet Take 1 tablet (40 mg total) by mouth 2 (two) times daily. 05/27/20 05/27/21 Yes Phineas Semen, MD  liraglutide (VICTOZA) 18 MG/3ML SOPN Inject 18 mg into the skin.   Yes [provider]  lisinopril (ZESTRIL) 20 MG tablet Take 20 mg by mouth daily.   Yes [provider]  metFORMIN (GLUCOPHAGE) 500 MG tablet Take by mouth 2 (two) times daily with a meal.   Yes [provider]  spironolactone (ALDACTONE) 25 MG tablet Take 25 mg by mouth daily.   Yes [provider]    Review of Systems  Constitutional: Positive for fatigue. Negative for appetite change.  HENT: Negative for congestion, postnasal drip and sore throat.   Eyes: Negative.   Respiratory: Positive for apnea and shortness of breath. Negative for cough and chest tightness.        + snoring  Cardiovascular: Positive for leg swelling (improving). Negative for chest pain and palpitations.  Gastrointestinal: Negative for abdominal distention and abdominal pain.  Endocrine: Negative.   Genitourinary: Negative.   Musculoskeletal: Negative for back pain  and neck pain.  Skin: Negative.   Allergic/Immunologic: Negative.   Neurological: Negative for dizziness and light-headedness.  Hematological: Negative for adenopathy. Does not bruise/bleed easily.  Psychiatric/Behavioral: Negative for dysphoric mood and sleep disturbance (sleeping on 1 pillow). The patient is not nervous/anxious.    Vitals:   06/08/20 1233  BP: 135/66  Pulse: 82  Resp: 18  SpO2: 95%  Weight: (!) 365 lb 4 oz (165.7 kg)  Height: 5\' 6"  (1.676 m)   Wt Readings from Last 3 Encounters:  06/08/20 (!) 365 lb 4 oz (165.7 kg)  10/28/13 296 lb (134.3 kg)   07/22/13 291 lb (132 kg)   Lab Results  Component Value Date   CREATININE 0.99 05/27/2020   CREATININE 0.88 04/22/2012    Physical Exam Vitals and nursing note reviewed. Exam conducted with a chaperone present (wife present).  Constitutional:      Appearance: Normal appearance.  HENT:     Head: Normocephalic and atraumatic.  Cardiovascular:     Rate and Rhythm: Normal rate and regular rhythm.  Pulmonary:     Effort: Pulmonary effort is normal. No respiratory distress.     Breath sounds: No wheezing or rales.  Abdominal:     General: Abdomen is flat. There is no distension.     Palpations: Abdomen is soft.  Musculoskeletal:        General: No tenderness.     Cervical back: Normal range of motion and neck supple.     Right lower leg: Edema (1+ pitting) present.     Left lower leg: Edema (1+ pitting) present.  Skin:    General: Skin is warm and dry.  Neurological:     General: No focal deficit present.     Mental Status: He is alert and oriented to person, place, and time.  Psychiatric:        Mood and Affect: Mood normal.        Behavior: Behavior normal.        Thought Content: Thought content normal.    Assessment & Plan:  1: Questionable heart failure with unknown ejection fraction- - NYHA class III - euvolemic today - not weighing daily but does have scales; instructed to weigh every morning after using the bathroom, write the weight down and call for an overnight weight gain of >2 pounds or a weekly weight gain of >5 pounds - echo ordered for 07/03/20 - wife doesn't cook with salt but he does admit to adding salt to some of his food; reviewed the importance of not adding salt to his food - encouraged him to elevate his legs when sitting for long periods of time - instructed to get compression socks and put them on every morning with removal at bedtime - drinking ~ 1L of water along with 2 sodas and 2 cups of tea daily - BNP 05/27/20 was 54.3  2: HTN- - BP looks  good today - follows with PCP at [facility] & returns 06/12/20 - BMP from 05/27/20 reviewed and showed sodium 135, potassium 4.4, creatinine 0.99 and GFR >60  3: Diabetes-  - glucose here today was 132  4: Snoring- - will make referral for sleep study to rule out sleep apnea   Patient did not bring his medications nor a list. Each medication was verbally reviewed with the patient and he was encouraged to bring the bottles to every visit to confirm accuracy of list.  Return in 1 month or sooner for any questions/problems before then.  Patient requests AVS patient instructions printed in spanish and another copy printed in english so that he can take that one to social services.

## 2020-06-08 ENCOUNTER — Ambulatory Visit: Payer: Self-pay | Attending: Family | Admitting: Family

## 2020-06-08 ENCOUNTER — Other Ambulatory Visit: Payer: Self-pay

## 2020-06-08 ENCOUNTER — Encounter: Payer: Self-pay | Admitting: Family

## 2020-06-08 VITALS — BP 135/66 | HR 82 | Resp 18 | Ht 66.0 in | Wt 365.2 lb

## 2020-06-08 DIAGNOSIS — Z87891 Personal history of nicotine dependence: Secondary | ICD-10-CM | POA: Insufficient documentation

## 2020-06-08 DIAGNOSIS — I10 Essential (primary) hypertension: Secondary | ICD-10-CM | POA: Insufficient documentation

## 2020-06-08 DIAGNOSIS — R5383 Other fatigue: Secondary | ICD-10-CM | POA: Insufficient documentation

## 2020-06-08 DIAGNOSIS — Z79899 Other long term (current) drug therapy: Secondary | ICD-10-CM | POA: Insufficient documentation

## 2020-06-08 DIAGNOSIS — E119 Type 2 diabetes mellitus without complications: Secondary | ICD-10-CM | POA: Insufficient documentation

## 2020-06-08 DIAGNOSIS — Z7984 Long term (current) use of oral hypoglycemic drugs: Secondary | ICD-10-CM | POA: Insufficient documentation

## 2020-06-08 DIAGNOSIS — R0602 Shortness of breath: Secondary | ICD-10-CM | POA: Insufficient documentation

## 2020-06-08 DIAGNOSIS — R0683 Snoring: Secondary | ICD-10-CM | POA: Insufficient documentation

## 2020-06-08 DIAGNOSIS — M7989 Other specified soft tissue disorders: Secondary | ICD-10-CM | POA: Insufficient documentation

## 2020-06-08 DIAGNOSIS — R600 Localized edema: Secondary | ICD-10-CM | POA: Insufficient documentation

## 2020-06-08 DIAGNOSIS — I509 Heart failure, unspecified: Secondary | ICD-10-CM

## 2020-06-08 DIAGNOSIS — Z7901 Long term (current) use of anticoagulants: Secondary | ICD-10-CM | POA: Insufficient documentation

## 2020-06-08 LAB — GLUCOSE, CAPILLARY: Glucose-Capillary: 132 mg/dL — ABNORMAL HIGH (ref 70–99)

## 2020-06-08 NOTE — Patient Instructions (Addendum)
Begin weighing daily and call for an overnight weight gain of > 2 pounds or a weekly weight gain of >5 pounds.    Heart Failure Eating Plan Heart failure, also called congestive heart failure, occurs when your heart does not pump blood well enough to meet your body's needs for oxygen-rich blood. Heart failure is a long-term (chronic) condition. Living with heart failure can be challenging. However, following your health care provider's instructions about a healthy lifestyle and working with a diet and nutrition specialist (dietitian) to choose the right foods may help to improve your symptoms. What are tips for following this plan? Reading food labels  Check food labels for the amount of sodium per serving. Choose foods that have less than 140 mg (milligrams) of sodium in each serving.  Check food labels for the number of calories per serving. This is important if you need to limit your daily calorie intake to lose weight.  Check food labels for the serving size. If you eat more than one serving, you will be eating more sodium and calories than what is listed on the label.  Look for foods that are labeled as "sodium-free," "very low sodium," or "low sodium." ? Foods labeled as "reduced sodium" or "lightly salted" may still have more sodium than what is recommended for you. Cooking  Avoid adding salt when cooking. Ask your health care provider or dietitian before using salt substitutes.  Season food with salt-free seasonings, spices, or herbs. Check the label of seasoning mixes to make sure they do not contain salt.  Cook with heart-healthy oils, such as olive, canola, soybean, or sunflower oil.  Do not fry foods. Cook foods using low-fat methods, such as baking, boiling, grilling, and broiling.  Limit unhealthy fats when cooking by: ? Removing the skin from poultry, such as chicken. ? Removing all visible fats from meats. ? Skimming the fat off from stews, soups, and gravies before  serving them. Meal planning   Limit your intake of: ? Processed, canned, or pre-packaged foods. ? Foods that are high in trans fat, such as fried foods. ? Sweets, desserts, sugary drinks, and other foods with added sugar. ? Full-fat dairy products, such as whole milk.  Eat a balanced diet that includes: ? 4-5 servings of fruit each day and 4-5 servings of vegetables each day. At each meal, try to fill half of your plate with fruits and vegetables. ? Up to 6-8 servings of whole grains each day. ? Up to 2 servings of lean meat, poultry, or fish each day. One serving of meat is equal to 3 oz. This is about the same size as a deck of cards. ? 2 servings of low-fat dairy each day. ? Heart-healthy fats. Healthy fats called omega-3 fatty acids are found in foods such as flaxseed and cold-water fish like sardines, salmon, and mackerel.  Aim to eat 25-35 g (grams) of fiber a day. Foods that are high in fiber include apples, broccoli, carrots, beans, peas, and whole grains.  Do not add salt or condiments that contain salt (such as soy sauce) to foods before eating.  When eating at a restaurant, ask that your food be prepared with less salt or no salt, if possible.  Try to eat 2 or more vegetarian meals each week.  Eat more home-cooked food and eat less restaurant, buffet, and fast food. General information  Do not eat more than 2,300 mg of salt (sodium) a day. The amount of sodium that is recommended for you  may be lower, depending on your condition.  Maintain a healthy body weight as directed. Ask your health care provider what a healthy weight is for you. ? Check your weight every day. ? Work with your health care provider and dietitian to make a plan that is right for you to lose weight or maintain your current weight.  Limit how much fluid you drink. Ask your health care provider or dietitian how much fluid you can have each day.  Limit or avoid alcohol as told by your health care  provider or dietitian. Recommended foods The items listed may not be a complete list. Talk with your dietitian about what dietary choices are best for you. Fruits All fresh, frozen, and canned fruits. Dried fruits, such as raisins, prunes, and cranberries. Vegetables All fresh vegetables. Vegetables that are frozen without sauce or added salt. Low-sodium or sodium-free canned vegetables. Grains Bread with less than 80 mg of sodium per slice. Whole-wheat pasta, quinoa, and brown rice. Oats and oatmeal. Barley. Millet. Grits and cream of wheat. Whole-grain and whole-wheat cold cereal. Meats and other protein foods Lean cuts of meat. Skinless chicken and [place]. Fish with high omega-3 fatty acids, such as salmon, sardines, and other cold-water fishes. Eggs. Dried beans, peas, and edamame. Unsalted nuts and nut butters. Dairy Low-fat or nonfat (skim) milk and dried milk. Rice milk, soy milk, and almond milk. Low-fat or nonfat yogurt. Small amounts of reduced-sodium block cheese. Low-sodium cottage cheese. Fats and oils Olive, canola, soybean, flaxseed, or sunflower oil. Avocado. Sweets and desserts Apple sauce. Granola bars. Sugar-free pudding and gelatin. Frozen fruit bars. Seasoning and other foods Fresh and dried herbs. Lemon or lime juice. Vinegar. Low-sodium ketchup. Salt-free marinades, salad dressings, sauces, and seasonings. The items listed above may not be a complete list of foods and beverages you can eat. Contact a dietitian for more information. Foods to avoid The items listed may not be a complete list. Talk with your dietitian about what dietary choices are best for you. Fruits Fruits that are dried with sodium-containing preservatives. Vegetables Canned vegetables. Frozen vegetables with sauce or seasonings. Creamed vegetables. [place] fries. Onion rings. Pickled vegetables and sauerkraut. Grains Bread with more than 80 mg of sodium per slice. Hot or cold cereal with more than  140 mg sodium per serving. Salted pretzels and crackers. Pre-packaged breadcrumbs. Bagels, croissants, and biscuits. Meats and other protein foods Ribs and chicken wings. Bacon, ham, pepperoni, bologna, salami, and packaged luncheon meats. Hot dogs, bratwurst, and sausage. Canned meat. Smoked meat and fish. Salted nuts and seeds. Dairy Whole milk, half-and-half, and cream. Buttermilk. Processed cheese, cheese spreads, and cheese curds. Regular cottage cheese. Feta cheese. Shredded cheese. String cheese. Fats and oils Butter, lard, shortening, ghee, and bacon fat. Canned and packaged gravies. Seasoning and other foods Onion salt, garlic salt, table salt, and sea salt. Marinades. Regular salad dressings. Relishes, pickles, and olives. Meat flavorings and tenderizers, and bouillon cubes. Horseradish, ketchup, and mustard. Worcestershire sauce. Teriyaki sauce, soy sauce (including reduced sodium). Hot sauce and Tabasco sauce. Steak sauce, fish sauce, oyster sauce, and cocktail sauce. Taco seasonings. Barbecue sauce. Tartar sauce. The items listed above may not be a complete list of foods and beverages you should avoid. Contact a dietitian for more information. Summary  A heart failure eating plan includes changes that limit your intake of sodium and unhealthy fat, and it may help you lose weight or maintain a healthy weight. Your health care provider may also recommend limiting how much  fluid you drink.  Most people with heart failure should eat no more than 2,300 mg of salt (sodium) a day. The amount of sodium that is recommended for you may be lower, depending on your condition.  Contact your health care provider or dietitian before making any major changes to your diet. This information is not intended to replace advice given to you by your health care provider. Make sure you discuss any questions you have with your health care provider. Document Revised: 10/01/2018 Document Reviewed:  12/20/2016 Elsevier Patient Education  2020 [employer].

## 2020-06-29 NOTE — Progress Notes (Signed)
Patient ID: Benjamin Moran, male    DOB: 1971/06/19, 49 y.o.   MRN: [id number]  [facility] interpreter present during the entire visit  HPI  Benjamin Moran is a 49 y/o male with a history of DM, HTN and previous tobacco use.   Patient did not show earlier today for his echo.   Was in the ED 05/27/20 due to leg swelling that was present for the last 4 days. Evaluated and released.   He presents today for a follow-up visit with a chief complaint of moderate fatigue upon minimal exertion. He describes this as chronic in nature having been present for several months. He has associated shortness of breath, apneic episodes, pedal edema, knee pain and fluctuating weight. He denies any difficulty sleeping, dizziness, abdominal distention, palpitations, chest pain or cough.   Home weight chart reviewed and weight fluctuates between 360- 380's. Currently weighing on an analog scale. Requesting a letter stating that bariatric surgery would be beneficial for his health and it would not be for cosmetic reasons.   Past Medical History:  Diagnosis Date  . Borderline diabetic   . Diabetes mellitus without complication (HCC)   . Hypertension    Past Surgical History:  Procedure Laterality Date  . APPENDECTOMY     Family History  Problem Relation Age of Onset  . Breast cancer Cousin    Social History   Tobacco Use  . Smoking status: Former Smoker    Quit date: 08/19/2006    Years since quitting: 13.8  . Smokeless tobacco: Never Used  Substance Use Topics  . Alcohol use: No   Allergies  Allergen Reactions  . Penicillins Hives    Other reaction(s): Unknown   Prior to Admission medications   Medication Sig Start Date End Date Taking? Authorizing Provider  atorvastatin (LIPITOR) 10 MG tablet Take 10 mg by mouth daily.   Yes [provider]  carvedilol (COREG) 3.125 MG tablet Take 3.125 mg by mouth 2 (two) times daily with a meal.   Yes [provider]   furosemide (LASIX) 40 MG tablet Take 1 tablet (40 mg total) by mouth 2 (two) times daily. 05/27/20 05/27/21 Yes Phineas Semen, MD  liraglutide (VICTOZA) 18 MG/3ML SOPN Inject 18 mg into the skin.   Yes [provider]  lisinopril (ZESTRIL) 20 MG tablet Take 20 mg by mouth daily.   Yes [provider]  metFORMIN (GLUCOPHAGE) 500 MG tablet Take by mouth 2 (two) times daily with a meal.   Yes [provider]  spironolactone (ALDACTONE) 25 MG tablet Take 25 mg by mouth daily.   Yes [provider]    Review of Systems  Constitutional: Positive for fatigue. Negative for appetite change.  HENT: Negative for congestion, postnasal drip and sore throat.   Eyes: Negative.   Respiratory: Positive for apnea and shortness of breath. Negative for cough and chest tightness.        + snoring  Cardiovascular: Positive for leg swelling (improving). Negative for chest pain and palpitations.  Gastrointestinal: Negative for abdominal distention and abdominal pain.  Endocrine: Negative.   Genitourinary: Negative.   Musculoskeletal: Positive for arthralgias (knees). Negative for back pain and neck pain.  Skin: Negative.   Allergic/Immunologic: Negative.   Neurological: Negative for dizziness and light-headedness.  Hematological: Negative for adenopathy. Does not bruise/bleed easily.  Psychiatric/Behavioral: Negative for dysphoric mood and sleep disturbance (sleeping on 1 pillow). The patient is not nervous/anxious.    Vitals:   07/03/20  1235  BP: 135/74  Pulse: 97  Resp: 20  SpO2: 90%  Weight: (!) 358 lb (162.4 kg)  Height: 5\' 6"  (1.676 m)   Wt Readings from Last 3 Encounters:  07/03/20 (!) 358 lb (162.4 kg)  06/08/20 (!) 365 lb 4 oz (165.7 kg)  10/28/13 296 lb (134.3 kg)   Lab Results  Component Value Date   CREATININE 0.99 05/27/2020   CREATININE 0.88 04/22/2012    Physical Exam Vitals and nursing note reviewed. Exam conducted with a chaperone present  (wife present).  Constitutional:      Appearance: Normal appearance.  HENT:     Head: Normocephalic and atraumatic.  Cardiovascular:     Rate and Rhythm: Normal rate and regular rhythm.  Pulmonary:     Effort: Pulmonary effort is normal. No respiratory distress.     Breath sounds: No wheezing or rales.  Abdominal:     General: Abdomen is flat. There is no distension.     Palpations: Abdomen is soft.  Musculoskeletal:        General: No tenderness.     Cervical back: Normal range of motion and neck supple.     Right lower leg: Edema (1+ pitting) present.     Left lower leg: Edema (1+ pitting) present.  Skin:    General: Skin is warm and dry.  Neurological:     General: No focal deficit present.     Mental Status: He is alert and oriented to person, place, and time.  Psychiatric:        Mood and Affect: Mood normal.        Behavior: Behavior normal.        Thought Content: Thought content normal.    Assessment & Plan:  1: Questionable heart failure with unknown ejection fraction- - NYHA class III - euvolemic today - weighing daily although has wide swings in weight; encouraged him to get digital scales for better reading of weight; reminded to call for an overnight weight gain of >2 pounds or a weekly weight gain of >5 pounds - weight down 7 pounds from last visit here 1 month ago - patient did not show for his echo earlier today; this has been r/s to 07/17/20 - wife doesn't cook with salt but he does admit to adding salt to some of his food; reviewed the importance of not adding salt to his food - encouraged him to elevate his legs when sitting for long periods of time - instructed to get compression socks and put them on every morning with removal at bedtime as he hasn't gotten them yet - drinking ~ 1L of water along with 2 sodas and 2 cups of tea daily - BNP 05/27/20 was 54.3 - has received 2 covid vaccines  2: HTN- - BP looks good today - follows with PCP at [facility] later this week - BMP from 05/27/20 reviewed and showed sodium 135, potassium 4.4, creatinine 0.99 and GFR >60  3: Diabetes-  - glucose here today was 167  4: Snoring- - sleep study referral made at last visit to rule out sleep apnea - patient hasn't heard anything about this yet - letter given stating that bariatric surgery would be beneficial for his health   Patient did not bring his medications nor a list. Each medication was verbally reviewed with the patient and he was encouraged to bring the bottles to every visit to confirm accuracy of list.  Return in 1 month or sooner for  any questions/problems before then.

## 2020-07-03 ENCOUNTER — Ambulatory Visit: Admission: RE | Admit: 2020-07-03 | Payer: Self-pay | Source: Ambulatory Visit

## 2020-07-03 ENCOUNTER — Encounter: Payer: Self-pay | Admitting: Family

## 2020-07-03 ENCOUNTER — Ambulatory Visit: Payer: Self-pay | Attending: Family | Admitting: Family

## 2020-07-03 ENCOUNTER — Other Ambulatory Visit: Payer: Self-pay

## 2020-07-03 VITALS — BP 135/74 | HR 97 | Resp 20 | Ht 66.0 in | Wt 358.0 lb

## 2020-07-03 DIAGNOSIS — M25569 Pain in unspecified knee: Secondary | ICD-10-CM | POA: Insufficient documentation

## 2020-07-03 DIAGNOSIS — R600 Localized edema: Secondary | ICD-10-CM | POA: Insufficient documentation

## 2020-07-03 DIAGNOSIS — R5383 Other fatigue: Secondary | ICD-10-CM | POA: Insufficient documentation

## 2020-07-03 DIAGNOSIS — I509 Heart failure, unspecified: Secondary | ICD-10-CM

## 2020-07-03 DIAGNOSIS — R0683 Snoring: Secondary | ICD-10-CM | POA: Insufficient documentation

## 2020-07-03 DIAGNOSIS — I10 Essential (primary) hypertension: Secondary | ICD-10-CM | POA: Insufficient documentation

## 2020-07-03 DIAGNOSIS — Z7984 Long term (current) use of oral hypoglycemic drugs: Secondary | ICD-10-CM | POA: Insufficient documentation

## 2020-07-03 DIAGNOSIS — R0602 Shortness of breath: Secondary | ICD-10-CM | POA: Insufficient documentation

## 2020-07-03 DIAGNOSIS — Z87891 Personal history of nicotine dependence: Secondary | ICD-10-CM | POA: Insufficient documentation

## 2020-07-03 DIAGNOSIS — E119 Type 2 diabetes mellitus without complications: Secondary | ICD-10-CM | POA: Insufficient documentation

## 2020-07-03 DIAGNOSIS — Z79899 Other long term (current) drug therapy: Secondary | ICD-10-CM | POA: Insufficient documentation

## 2020-07-03 LAB — GLUCOSE, CAPILLARY: Glucose-Capillary: 167 mg/dL — ABNORMAL HIGH (ref 70–99)

## 2020-07-03 NOTE — Patient Instructions (Signed)
Continue weighing daily and call for an overnight weight gain of > 2 pounds or a weekly weight gain of >5 pounds. 

## 2020-07-17 ENCOUNTER — Ambulatory Visit
Admission: RE | Admit: 2020-07-17 | Discharge: 2020-07-17 | Disposition: A | Discharge status: Home / Self Care | Payer: Self-pay | Source: Ambulatory Visit | Attending: Family | Admitting: Family

## 2020-07-17 ENCOUNTER — Other Ambulatory Visit: Payer: Self-pay

## 2020-07-17 DIAGNOSIS — I509 Heart failure, unspecified: Secondary | ICD-10-CM | POA: Insufficient documentation

## 2020-07-17 LAB — ECHOCARDIOGRAM COMPLETE: S' Lateral: 3.23 cm

## 2020-07-17 NOTE — Progress Notes (Signed)
*  PRELIMINARY RESULTS* Echocardiogram 2D Echocardiogram has been performed.  Cristela Blue 07/17/2020, 10:37 AM

## 2020-07-28 NOTE — Progress Notes (Deleted)
Patient ID: Benjamin Moran, male    DOB: 06/27/1971, 49 y.o.   MRN: [id number]  [facility] interpreter present during the entire visit  HPI  Mr Benjamin Moran is a 49 y/o male with a history of DM, HTN and previous tobacco use.   Echo report from 07/17/20 reviewed and showed an EF of 55-60%.   Was in the ED 05/27/20 due to leg swelling that was present for the last 4 days. Evaluated and released.   He presents today for a follow-up visit with a chief complaint of   Past Medical History:  Diagnosis Date  . Borderline diabetic   . Diabetes mellitus without complication (HCC)   . Hypertension    Past Surgical History:  Procedure Laterality Date  . APPENDECTOMY     Family History  Problem Relation Age of Onset  . Breast cancer Cousin    Social History   Tobacco Use  . Smoking status: Former Smoker    Quit date: 08/19/2006    Years since quitting: 13.9  . Smokeless tobacco: Never Used  Substance Use Topics  . Alcohol use: No   Allergies  Allergen Reactions  . Penicillins Hives    Other reaction(s): Unknown     Review of Systems  Constitutional: Positive for fatigue. Negative for appetite change.  HENT: Negative for congestion, postnasal drip and sore throat.   Eyes: Negative.   Respiratory: Positive for apnea and shortness of breath. Negative for cough and chest tightness.        + snoring  Cardiovascular: Positive for leg swelling (improving). Negative for chest pain and palpitations.  Gastrointestinal: Negative for abdominal distention and abdominal pain.  Endocrine: Negative.   Genitourinary: Negative.   Musculoskeletal: Positive for arthralgias (knees). Negative for back pain and neck pain.  Skin: Negative.   Allergic/Immunologic: Negative.   Neurological: Negative for dizziness and light-headedness.  Hematological: Negative for adenopathy. Does not bruise/bleed easily.  Psychiatric/Behavioral: Negative for dysphoric mood and sleep disturbance  (sleeping on 1 pillow). The patient is not nervous/anxious.      Physical Exam Vitals and nursing note reviewed. Exam conducted with a chaperone present (wife present).  Constitutional:      Appearance: Normal appearance.  HENT:     Head: Normocephalic and atraumatic.  Cardiovascular:     Rate and Rhythm: Normal rate and regular rhythm.  Pulmonary:     Effort: Pulmonary effort is normal. No respiratory distress.     Breath sounds: No wheezing or rales.  Abdominal:     General: Abdomen is flat. There is no distension.     Palpations: Abdomen is soft.  Musculoskeletal:        General: No tenderness.     Cervical back: Normal range of motion and neck supple.     Right lower leg: Edema (1+ pitting) present.     Left lower leg: Edema (1+ pitting) present.  Skin:    General: Skin is warm and dry.  Neurological:     General: No focal deficit present.     Mental Status: He is alert and oriented to person, place, and time.  Psychiatric:        Mood and Affect: Mood normal.        Behavior: Behavior normal.        Thought Content: Thought content normal.    Assessment & Plan:  1: Chronic heart failure with preserved ejection fraction without structural changes- - NYHA class III - euvolemic today - weighing  daily although has wide swings in weight; encouraged him to get digital scales for better reading of weight; reminded to call for an overnight weight gain of >2 pounds or a weekly weight gain of >5 pounds - weight 358 pounds from last visit here 1 month ago - wife doesn't cook with salt but he does admit to adding salt to some of his food; reviewed the importance of not adding salt to his food - encouraged him to elevate his legs when sitting for long periods of time - instructed to get compression socks and put them on every morning with removal at bedtime as he hasn't gotten them yet - drinking ~ 1L of water along with 2 sodas and 2 cups of tea daily - BNP 05/27/20 was 54.3 -  has received 2 covid vaccines  2: HTN- - BP  - follows with PCP at [facility] later this week - BMP from 05/27/20 reviewed and showed sodium 135, potassium 4.4, creatinine 0.99 and GFR >60  3: Diabetes-  - glucose here today was   4: Snoring- - sleep study referral made at last visit to rule out sleep apnea - patient hasn't heard anything about this yet - letter given stating that bariatric surgery would be beneficial for his health   Patient did not bring his medications nor a list. Each medication was verbally reviewed with the patient and he was encouraged to bring the bottles to every visit to confirm accuracy of list.

## 2020-07-31 ENCOUNTER — Telehealth: Payer: Self-pay | Admitting: Family

## 2020-07-31 ENCOUNTER — Ambulatory Visit: Payer: Self-pay | Admitting: Family

## 2020-07-31 NOTE — Telephone Encounter (Signed)
Patient did not show for his [facility] appointment on 07/31/20. Will contact interpreter services and attempt to reschedule.

## 2020-08-02 ENCOUNTER — Emergency Department: Payer: Medicaid Other

## 2020-08-02 ENCOUNTER — Inpatient Hospital Stay: Payer: Medicaid Other

## 2020-08-02 ENCOUNTER — Other Ambulatory Visit: Payer: Self-pay

## 2020-08-02 ENCOUNTER — Inpatient Hospital Stay
Admission: EM | Admit: 2020-08-02 | Discharge: 2020-08-05 | DRG: 291 | Disposition: A | Discharge status: Home Health | Payer: Medicaid Other | Attending: Internal Medicine | Admitting: Internal Medicine

## 2020-08-02 ENCOUNTER — Encounter: Payer: Self-pay | Admitting: Emergency Medicine

## 2020-08-02 DIAGNOSIS — Z20822 Contact with and (suspected) exposure to covid-19: Secondary | ICD-10-CM | POA: Diagnosis present

## 2020-08-02 DIAGNOSIS — R7401 Elevation of levels of liver transaminase levels: Secondary | ICD-10-CM | POA: Diagnosis present

## 2020-08-02 DIAGNOSIS — E1165 Type 2 diabetes mellitus with hyperglycemia: Secondary | ICD-10-CM | POA: Diagnosis present

## 2020-08-02 DIAGNOSIS — I110 Hypertensive heart disease with heart failure: Principal | ICD-10-CM | POA: Diagnosis present

## 2020-08-02 DIAGNOSIS — I248 Other forms of acute ischemic heart disease: Secondary | ICD-10-CM | POA: Diagnosis present

## 2020-08-02 DIAGNOSIS — Z6841 Body Mass Index (BMI) 40.0 and over, adult: Secondary | ICD-10-CM

## 2020-08-02 DIAGNOSIS — Z9049 Acquired absence of other specified parts of digestive tract: Secondary | ICD-10-CM | POA: Diagnosis not present

## 2020-08-02 DIAGNOSIS — Z87891 Personal history of nicotine dependence: Secondary | ICD-10-CM | POA: Diagnosis not present

## 2020-08-02 DIAGNOSIS — Z880 Allergy status to penicillin: Secondary | ICD-10-CM

## 2020-08-02 DIAGNOSIS — R531 Weakness: Secondary | ICD-10-CM | POA: Diagnosis present

## 2020-08-02 DIAGNOSIS — Z79899 Other long term (current) drug therapy: Secondary | ICD-10-CM

## 2020-08-02 DIAGNOSIS — G4733 Obstructive sleep apnea (adult) (pediatric): Secondary | ICD-10-CM | POA: Diagnosis present

## 2020-08-02 DIAGNOSIS — G9341 Metabolic encephalopathy: Secondary | ICD-10-CM | POA: Diagnosis present

## 2020-08-02 DIAGNOSIS — Z7984 Long term (current) use of oral hypoglycemic drugs: Secondary | ICD-10-CM

## 2020-08-02 DIAGNOSIS — R0902 Hypoxemia: Secondary | ICD-10-CM

## 2020-08-02 DIAGNOSIS — I5033 Acute on chronic diastolic (congestive) heart failure: Secondary | ICD-10-CM | POA: Diagnosis present

## 2020-08-02 DIAGNOSIS — J9602 Acute respiratory failure with hypercapnia: Secondary | ICD-10-CM | POA: Diagnosis present

## 2020-08-02 DIAGNOSIS — N289 Disorder of kidney and ureter, unspecified: Secondary | ICD-10-CM | POA: Diagnosis present

## 2020-08-02 DIAGNOSIS — Z803 Family history of malignant neoplasm of breast: Secondary | ICD-10-CM

## 2020-08-02 DIAGNOSIS — J9691 Respiratory failure, unspecified with hypoxia: Secondary | ICD-10-CM

## 2020-08-02 DIAGNOSIS — I214 Non-ST elevation (NSTEMI) myocardial infarction: Secondary | ICD-10-CM

## 2020-08-02 DIAGNOSIS — E785 Hyperlipidemia, unspecified: Secondary | ICD-10-CM | POA: Diagnosis present

## 2020-08-02 DIAGNOSIS — E1169 Type 2 diabetes mellitus with other specified complication: Secondary | ICD-10-CM

## 2020-08-02 DIAGNOSIS — J9601 Acute respiratory failure with hypoxia: Secondary | ICD-10-CM | POA: Diagnosis present

## 2020-08-02 HISTORY — DX: Disorder of kidney and ureter, unspecified: N28.9

## 2020-08-02 LAB — CBC
HCT: 51.2 % (ref 39.0–52.0)
Hemoglobin: 15.3 g/dL (ref 13.0–17.0)
MCH: 26.3 pg (ref 26.0–34.0)
MCHC: 29.9 g/dL — ABNORMAL LOW (ref 30.0–36.0)
MCV: 88 fL (ref 80.0–100.0)
Platelets: 343 10*3/uL (ref 150–400)
RBC: 5.82 MIL/uL — ABNORMAL HIGH (ref 4.22–5.81)
RDW: 15.4 % (ref 11.5–15.5)
WBC: 13.6 10*3/uL — ABNORMAL HIGH (ref 4.0–10.5)
nRBC: 0.5 % — ABNORMAL HIGH (ref 0.0–0.2)

## 2020-08-02 LAB — TROPONIN I (HIGH SENSITIVITY)
Troponin I (High Sensitivity): 40 ng/L — ABNORMAL HIGH (ref <18)
Troponin I (High Sensitivity): 107 ng/L (ref <18)

## 2020-08-02 LAB — COMPREHENSIVE METABOLIC PANEL
ALT: 172 U/L — ABNORMAL HIGH (ref 0–44)
AST: 108 U/L — ABNORMAL HIGH (ref 15–41)
Albumin: 3.2 g/dL — ABNORMAL LOW (ref 3.5–5.0)
Alkaline Phosphatase: 91 U/L (ref 38–126)
Anion gap: 9 (ref 5–15)
BUN: 22 mg/dL — ABNORMAL HIGH (ref 6–20)
CO2: 32 mmol/L (ref 22–32)
Calcium: 8.2 mg/dL — ABNORMAL LOW (ref 8.9–10.3)
Chloride: 93 mmol/L — ABNORMAL LOW (ref 98–111)
Creatinine, Ser: 1.13 mg/dL (ref 0.61–1.24)
GFR, Estimated: >60 mL/min (ref >60)
Glucose, Bld: 232 mg/dL — ABNORMAL HIGH (ref 70–99)
Potassium: 5.3 mmol/L — ABNORMAL HIGH (ref 3.5–5.1)
Sodium: 134 mmol/L — ABNORMAL LOW (ref 135–145)
Total Bilirubin: 0.6 mg/dL (ref 0.3–1.2)
Total Protein: 7.7 g/dL (ref 6.5–8.1)

## 2020-08-02 LAB — BLOOD GAS, ARTERIAL
Acid-Base Excess: 8.7 mmol/L — ABNORMAL HIGH (ref 0.0–2.0)
Allens test (pass/fail): POSITIVE — AB
Bicarbonate: 38.9 mmol/L — ABNORMAL HIGH (ref 20.0–28.0)
FIO2: 1
O2 Saturation: 97.3 %
Patient temperature: 37
pCO2 arterial: 79 mmHg (ref 32.0–48.0)
pH, Arterial: 7.3 — ABNORMAL LOW (ref 7.350–7.450)
pO2, Arterial: 103 mmHg (ref 83.0–108.0)

## 2020-08-02 LAB — RESP PANEL BY RT-PCR (FLU A&B, COVID) ARPGX2
Influenza A by PCR: NEGATIVE
Influenza B by PCR: NEGATIVE
SARS Coronavirus 2 by RT PCR: NEGATIVE

## 2020-08-02 LAB — APTT: aPTT: 32 seconds (ref 24–36)

## 2020-08-02 LAB — PROTIME-INR
INR: 1.1 (ref 0.8–1.2)
Prothrombin Time: 13.5 seconds (ref 11.4–15.2)

## 2020-08-02 LAB — BRAIN NATRIURETIC PEPTIDE: B Natriuretic Peptide: 263 pg/mL — ABNORMAL HIGH (ref 0.0–100.0)

## 2020-08-02 MED ORDER — HEPARIN BOLUS VIA INFUSION
4000.0000 [IU] | Freq: Once | INTRAVENOUS | Status: AC
  Administered 2020-08-02: 20:00:00 4000 [IU] via INTRAVENOUS
  Filled 2020-08-02: qty 4000

## 2020-08-02 MED ORDER — IOHEXOL 350 MG/ML SOLN
75.0000 mL | Freq: Once | INTRAVENOUS | Status: AC | PRN
  Administered 2020-08-02: 15:00:00 75 mL via INTRAVENOUS

## 2020-08-02 MED ORDER — ASPIRIN EC 325 MG PO TBEC
325.0000 mg | DELAYED_RELEASE_TABLET | Freq: Once | ORAL | Status: DC
  Filled 2020-08-02: qty 1

## 2020-08-02 MED ORDER — ASPIRIN 300 MG RE SUPP
300.0000 mg | Freq: Once | RECTAL | Status: AC
  Administered 2020-08-02: 300 mg via RECTAL
  Filled 2020-08-02: qty 1

## 2020-08-02 MED ORDER — HEPARIN (PORCINE) 25000 UT/250ML-% IV SOLN
1850.0000 [IU]/h | INTRAVENOUS | Status: DC
  Administered 2020-08-02: 20:00:00 1450 [IU]/h via INTRAVENOUS
  Administered 2020-08-03: 23:00:00 1850 [IU]/h via INTRAVENOUS
  Filled 2020-08-02 (×3): qty 250

## 2020-08-02 MED ORDER — ASPIRIN 300 MG RE SUPP: 300.0000 mg | Freq: Once | RECTAL | Status: DC

## 2020-08-02 NOTE — ED Notes (Signed)
Entered room to find pt resting with eyes closed; slow to arouse with tactile stimulation.  Pt unable to maintain arousal for extended period of time and slow to respond to questions and follow commands.  Vitals remain stable with pt on BiPaP --14/8/45% FiO2.  Hep drip infusing to 20g L AC @14 .16ml/hr without complications of redness, tenderness, edema.

## 2020-08-02 NOTE — ED Notes (Signed)
Entered room to find pt awake and alert after using urinal with assistance of wife who is at bedside.  Pt speech more clear and able to answer questions and follow commands at this time.  Vitals remain stable and pt remains on BiPaP 14/8/45%v - 95% O2 sats.

## 2020-08-02 NOTE — Progress Notes (Signed)
ANTICOAGULATION CONSULT NOTE - Initial Consult  Pharmacy Consult for Heparin Indication: chest pain/ACS  Allergies  Allergen Reactions  . Penicillins Hives    Other reaction(s): Unknown    Patient Measurements: Height: 5\' 6"  (167.6 cm) Weight: (!) 163.7 kg (361 lb) IBW/kg (Calculated) : 63.8 Heparin Dosing Weight: 104.9 kg  Vital Signs: Temp: 98.5 F (36.9 C) (12/15 1542) Temp Source: Axillary (12/15 1542) BP: 120/78 (12/15 1542) Pulse Rate: 75 (12/15 1542)  Labs: Recent Labs    08/02/20 1216 08/02/20 1440  HGB 15.3  --   HCT 51.2  --   PLT 343  --   CREATININE 1.13  --   TROPONINIHS 40* 107*    Estimated Creatinine Clearance: 113.5 mL/min (by C-G formula based on SCr of 1.13 mg/dL).   Medical History: Past Medical History:  Diagnosis Date  . Borderline diabetic   . Diabetes mellitus without complication (HCC)   . Hypertension   . Renal disorder     Assessment: Patient is a 49yo male presenting with shortness of breath and weakness. Troponin is trending up. Pharmacy consulted for Heparin dosing. No prior anticoagulants listed in home meds. Baseline labs are acceptable.  Goal of Therapy:  Heparin level 0.3-0.7 units/ml Monitor platelets by anticoagulation protocol: Yes   Plan:  Give 4000 units bolus x 1 Start heparin infusion at 1450 units/hr Check anti-Xa level in 6 hours and daily while on heparin Continue to monitor H&H and platelets  49yo, PharmD, BCPS 08/02/2020 4:55 PM

## 2020-08-02 NOTE — ED Notes (Signed)
Critical trop 107 called in by lab at this time -- provider notified via secure chat at this time; pt currently off the floor for CTA

## 2020-08-02 NOTE — ED Notes (Signed)
Pt placed on nonrebreather.  

## 2020-08-02 NOTE — ED Notes (Signed)
Pt now to CT dept via stretcher escorted by RT and CT tech
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
## 2020-08-02 NOTE — ED Notes (Signed)
Dr Garnette Czech notified via secure chat that ABG completed and has resulted

## 2020-08-02 NOTE — ED Notes (Signed)
Pt has returned back from CT dept via stretcher on NRB -- O2 sats 97% however when pt placed back on BiPaP (07/26/34% FiO2) O2 sats began dropping  -- now @88 %; attempting to ctc RT @[phone] -[phone] however no answer

## 2020-08-02 NOTE — H&P (Signed)
History and Physical   [facility] [place] @ [facility] Admission History and Physical [employer], D.O.    Patient Name: Benjamin Moran MR#: [id number] Date of Birth: [birth year] Date of Admission: 08/02/2020  Referring MD/NP/PA: Dr. Lenard Lance Primary Care Physician: Center, [facility]  Chief Complaint:  Chief Complaint  Patient presents with  . Shortness of Breath    HPI: Benjamin Moran is a 49 y.o. male with a known history of DM, HTN, OSA recently diagnosed but has not yet obtained his CPAP presents to the emergency department for evaluation of SOB.  Patient was in a usual state of health until lunchtime on Tues 12/14.  Patient's wife states that he complained of chest pain and shortness of breath as well as extreme fatigue.  He became very lethargic, slept all day.  When he was awake he was "acting like a robot," walking around aimlessly, confused and incoherent.  She brought him here because he was attempting to drive but kept falling asleep.  His O2 sats were initially found to be 30%, but came up to mid 80s on 5L.Marland Kitchen He was found to be hypercapneic and therefore was placed on bipap for acute hypoxic respiratory failure.   He is only responsive to painful stimuli and gives no history.  Wife denies drug use, trauma.  Patient denies fevers/chills, weakness, dizziness,  N/V/C/D, abdominal pain, dysuria/frequency, changes in mental status.    Otherwise there has been no change in status. Patient has been taking medication as prescribed and there has been no recent change in medication or diet.  No recent antibiotics.  There has been no recent illness, hospitalizations, travel or sick contacts.    EMS/ED Course: Medical admission has been requested for further management of Acute hypoxic respiratory failure with hypercapnia, NSTEMI. .  Review of Systems:  UNable to obtain 2/2 somnolence.    Past Medical History:  Diagnosis Date  . Borderline  diabetic   . Diabetes mellitus without complication (HCC)   . Hypertension   . Renal disorder     Past Surgical History:  Procedure Laterality Date  . APPENDECTOMY       reports that he quit smoking about 13 years ago. He has never used smokeless tobacco. He reports that he does not drink alcohol and does not use drugs.  Allergies  Allergen Reactions  . Penicillins Hives    Other reaction(s): Unknown    Family History  Problem Relation Age of Onset  . Breast cancer Cousin     Prior to Admission medications   Medication Sig Start Date End Date Taking? Authorizing Provider  atorvastatin (LIPITOR) 10 MG tablet Take 10 mg by mouth daily.    [provider]  carvedilol (COREG) 3.125 MG tablet Take 3.125 mg by mouth 2 (two) times daily with a meal.    [provider]  furosemide (LASIX) 40 MG tablet Take 1 tablet (40 mg total) by mouth 2 (two) times daily. 05/27/20 05/27/21  Phineas Semen, MD  liraglutide (VICTOZA) 18 MG/3ML SOPN Inject 18 mg into the skin.    [provider]  lisinopril (ZESTRIL) 20 MG tablet Take 20 mg by mouth daily.    [provider]  metFORMIN (GLUCOPHAGE) 500 MG tablet Take by mouth 2 (two) times daily with a meal.    [provider]  spironolactone (ALDACTONE) 25 MG tablet Take 25 mg by mouth daily.    [provider]    Physical Exam: Vitals:  08/02/20 1226 08/02/20 1230 08/02/20 1300 08/02/20 1330  BP:  (!) 142/87 139/85 (!) 142/97  Pulse: 89 83 83 81  Resp: (!) 27 (!) 24 (!) 25 (!) 22  Temp:      TempSrc:      SpO2: 96% 96% 97% 100%  Weight:      Height:        GENERAL: 49 y.o.-year-old hispanic male patient, morbidly obese, responsive only to sternal rub - will open his eyes but goes right back to sleep.  HEENT: Head atraumatic, normocephalic. Pupils equal. Mucus membranes moist. NECK: Supple. No JVD. CHEST: Normal breath sounds bilaterally. No wheezing, rales, rhonchi or crackles. No use  of accessory muscles of respiration.  No reproducible chest wall tenderness.  CARDIOVASCULAR: S1, S2 normal. No murmurs, rubs, or gallops. Cap refill <2 seconds. Pulses intact distally.  ABDOMEN: Soft, nondistended, nontender. No rebound, guarding, rigidity. Normoactive bowel sounds present in all four quadrants.  EXTREMITIES: No pedal edema, cyanosis, or clubbing. No calf tenderness or Homan's sign.  NEUROLOGIC: The patient is arousabel only to painful stimuli.  PSYCHIATRIC:  Normal affect, mood, thought content. SKIN: Warm, dry, and intact without obvious rash, lesion, or ulcer.    Labs on Admission:  CBC: Recent Labs  Lab 08/02/20 1216  WBC 13.6*  HGB 15.3  HCT 51.2  MCV 88.0  PLT 343   Basic Metabolic Panel: Recent Labs  Lab 08/02/20 1216  NA 134*  K 5.3*  CL 93*  CO2 32  GLUCOSE 232*  BUN 22*  CREATININE 1.13  CALCIUM 8.2*   GFR: Estimated Creatinine Clearance: 113.5 mL/min (by C-G formula based on SCr of 1.13 mg/dL). Liver Function Tests: Recent Labs  Lab 08/02/20 1216  AST 108*  ALT 172*  ALKPHOS 91  BILITOT 0.6  PROT 7.7  ALBUMIN 3.2*   No results for input(s): LIPASE, AMYLASE in the last 168 hours. No results for input(s): AMMONIA in the last 168 hours. Coagulation Profile: No results for input(s): INR, PROTIME in the last 168 hours. Cardiac Enzymes: No results for input(s): CKTOTAL, CKMB, CKMBINDEX, TROPONINI in the last 168 hours. BNP (last 3 results) No results for input(s): PROBNP in the last 8760 hours. HbA1C: No results for input(s): HGBA1C in the last 72 hours. CBG: No results for input(s): GLUCAP in the last 168 hours. Lipid Profile: No results for input(s): CHOL, HDL, LDLCALC, TRIG, CHOLHDL, LDLDIRECT in the last 72 hours. Thyroid Function Tests: No results for input(s): TSH, T4TOTAL, FREET4, T3FREE, THYROIDAB in the last 72 hours. Anemia Panel: No results for input(s): VITAMINB12, FOLATE, FERRITIN, TIBC, IRON, RETICCTPCT in the last  72 hours. Urine analysis:    Component Value Date/Time   COLORURINE Straw 04/22/2012 2050   APPEARANCEUR Clear 04/22/2012 2050   LABSPEC 1.009 04/22/2012 2050   PHURINE 7.0 04/22/2012 2050   GLUCOSEU Negative 04/22/2012 2050   HGBUR 1+ 04/22/2012 2050   BILIRUBINUR Negative 04/22/2012 2050   KETONESUR Negative 04/22/2012 2050   PROTEINUR Negative 04/22/2012 2050   NITRITE Negative 04/22/2012 2050   LEUKOCYTESUR Negative 04/22/2012 2050   Sepsis Labs: @LABRCNTIP (procalcitonin:4,lacticidven:4) ) Recent Results (from the past 240 hour(s))  Resp Panel by RT-PCR (Flu A&B, Covid) Nasopharyngeal Swab     Status: None   Collection Time: 08/02/20 12:13 PM   Specimen: Nasopharyngeal Swab; Nasopharyngeal(NP) swabs in vial transport medium  Result Value Ref Range Status   SARS Coronavirus 2 by RT PCR NEGATIVE NEGATIVE Final    Comment: (NOTE) SARS-CoV-2 target nucleic acids are  NOT DETECTED.  The SARS-CoV-2 RNA is generally detectable in upper respiratory specimens during the acute phase of infection. The lowest concentration of SARS-CoV-2 viral copies this assay can detect is 138 copies/mL. A negative result does not preclude SARS-Cov-2 infection and should not be used as the sole basis for treatment or other patient management decisions. A negative result may occur with  improper specimen collection/handling, submission of specimen other than nasopharyngeal swab, presence of viral mutation(s) within the areas targeted by this assay, and inadequate number of viral copies(<138 copies/mL). A negative result must be combined with clinical observations, patient history, and epidemiological information. The expected result is Negative.  Fact Sheet for Patients:  BloggerCourse.comhttps://www.fda.gov/media/152166/download  Fact Sheet for Healthcare Providers:  SeriousBroker.ithttps://www.fda.gov/media/152162/download  This test is no t yet approved or cleared by the [place] FDA and  has been authorized for  detection and/or diagnosis of SARS-CoV-2 by FDA under an Emergency Use Authorization (EUA). This EUA will remain  in effect (meaning this test can be used) for the duration of the COVID-19 declaration under Section 564(b)(1) of the Act, 21 U.S.C.section 360bbb-3(b)(1), unless the authorization is terminated  or revoked sooner.       Influenza A by PCR NEGATIVE NEGATIVE Final   Influenza B by PCR NEGATIVE NEGATIVE Final    Comment: (NOTE) The Xpert Xpress SARS-CoV-2/FLU/RSV plus assay is intended as an aid in the diagnosis of influenza from Nasopharyngeal swab specimens and should not be used as a sole basis for treatment. Nasal washings and aspirates are unacceptable for Xpert Xpress SARS-CoV-2/FLU/RSV testing.  Fact Sheet for Patients: BloggerCourse.comhttps://www.fda.gov/media/152166/download  Fact Sheet for Healthcare Providers: SeriousBroker.ithttps://www.fda.gov/media/152162/download  This test is not yet approved or cleared by the [place] FDA and has been authorized for detection and/or diagnosis of SARS-CoV-2 by FDA under an Emergency Use Authorization (EUA). This EUA will remain in effect (meaning this test can be used) for the duration of the COVID-19 declaration under Section 564(b)(1) of the Act, 21 U.S.C. section 360bbb-3(b)(1), unless the authorization is terminated or revoked.  Performed at [facility], [address] [place], [place] [address]      Radiological Exams on Admission: DG Chest Portable 1 View  Result Date: 08/02/2020 CLINICAL DATA:  Shortness of breath EXAM: PORTABLE CHEST 1 VIEW COMPARISON:  May 27, 2020 FINDINGS: The lungs are clear. Heart is borderline enlarged with pulmonary vascularity normal. No adenopathy. No bone lesions. IMPRESSION: Borderline cardiac enlargement.  Lungs clear. Electronically Signed   By: Bretta BangWilliam  Woodruff III M.D.   On: 08/02/2020 12:48    EKG: Normal sinus rhythm at 92 bpm with normal axis and nonspecific ST-T wave changes.    Assessment/Plan  This is a 49 y.o. male with a history of DM, HTN, OSA now being admitted with:  #. Acute hypoxic respiratory failure with hypercapnia, altered mental status - Admit ICU on BiPAP - CCM consulted d/w Sonda Rumbleana Blakeney - Check utox and head CT.   #. NSTEMI - Telemetry monitoring - Heparin drip - Trend troponins, check lipids and TSH. - Morphine, nitro, beta blocker, aspirin and statin ordered.   - Check echo - Cardiology consultation has been requested of Dr. Juliann Paresallwood.    #. H/o Diabetes - Accuchecks q4h with RISS coverage - Heart healthy, carb controlled diet - Hold Victoza, metformin  #. History of HTN - Continue Coreg, lisinopril, Lasix  #. History of HLD - Continue atorvastatin   Admission status: IP ICU IV Fluids: HL Diet/Nutrition: NPO while on BiPAP Consults called:  Cardiology, Critical Care  DVT Px: Heparin and early ambulation. Code Status: Full Code  Disposition Plan: To be determined.   All the records are reviewed and case discussed with ED provider. Management plans discussed with the patient and/or family who express understanding and agree with plan of care.  [employer] D.O. on 08/02/2020 at 3:40 PM CC: Primary care physician; [facility], [facility]   08/02/2020, 3:40 PM

## 2020-08-02 NOTE — ED Notes (Signed)
RT has been notified of patient O2 sats dropping @12 /8/35% FiO2 -- this nurse has increased pt to 07/26/49% with improvement to  91% -- RT will be in to re-assess patient soon but advises 91% ok for now to due to patient body habitus

## 2020-08-02 NOTE — ED Notes (Signed)
When in hall pt pulse ox level noted to be in low 70'S. Pt placed on 6L [place]. Oxygen remained in low 80's. Pt placed in room 6 and placed on non rebreather and saturation increased to 96%. Currently at 95% on nonrebreather at time of this note

## 2020-08-02 NOTE — ED Notes (Signed)
Pt remains lethargic with difficulty maintaining arousal -- otherwise VSS.  Pt now awaits CTA results -- medical interpreter standing by for provider and will translate while provider discusses results.

## 2020-08-02 NOTE — ED Provider Notes (Signed)
[facility] Emergency Department Provider Note  Time seen: 12:26 PM  I have reviewed the triage vital signs and the nursing notes. Spanish interpreter used for this evaluation.  HISTORY  Chief Complaint Shortness of Breath   HPI Benjamin Moran is a 49 y.o. male with a past medical history of diabetes, hypertension, obesity, presents to the emergency department with shortness of breath, somnolence/weakness.  According to the wife since yesterday the patient has been very tired and sleeping most of the day having trouble staying awake long enough to finish a sentence before falling back asleep patient was attempting to drive but was falling asleep while driving.  Has a history of sleep apnea and has a scheduled sleep study which has not occurred yet.  Patient has an initial room air saturation in the 30s, was placed on 2 L increased to 56% 4 L to 90%.  Patient currently on 5 L 85%.  Patient is somnolent but will awake and speak with you.  Wife is here who denies any recent fever or cough.  No recent vomiting or diarrhea.  States the patient has been doing well until yesterday.  Denies any new medications.  Patient has received Covid vaccinations the last of which however was in March.  Past Medical History:  Diagnosis Date  . Borderline diabetic   . Diabetes mellitus without complication (HCC)   . Hypertension   . Renal disorder     There are no problems to display for this patient.   Past Surgical History:  Procedure Laterality Date  . APPENDECTOMY      Prior to Admission medications   Medication Sig Start Date End Date Taking? Authorizing Provider  atorvastatin (LIPITOR) 10 MG tablet Take 10 mg by mouth daily.    [provider]  carvedilol (COREG) 3.125 MG tablet Take 3.125 mg by mouth 2 (two) times daily with a meal.    [provider]  furosemide (LASIX) 40 MG tablet Take 1 tablet (40 mg total) by mouth 2 (two) times daily.  05/27/20 05/27/21  Phineas Semen, MD  liraglutide (VICTOZA) 18 MG/3ML SOPN Inject 18 mg into the skin.    [provider]  lisinopril (ZESTRIL) 20 MG tablet Take 20 mg by mouth daily.    [provider]  metFORMIN (GLUCOPHAGE) 500 MG tablet Take by mouth 2 (two) times daily with a meal.    [provider]  spironolactone (ALDACTONE) 25 MG tablet Take 25 mg by mouth daily.    [provider]    Allergies  Allergen Reactions  . Penicillins Hives    Other reaction(s): Unknown    Family History  Problem Relation Age of Onset  . Breast cancer Cousin     Social History Social History   Tobacco Use  . Smoking status: Former Smoker    Quit date: 08/19/2006    Years since quitting: 13.9  . Smokeless tobacco: Never Used  Substance Use Topics  . Alcohol use: No  . Drug use: No    Review of Systems Constitutional: Negative for fever. Cardiovascular: Negative for chest pain. Respiratory: Positive for shortness of breath. Gastrointestinal: Negative for abdominal pain, vomiting  Musculoskeletal: Negative for musculoskeletal complaints Neurological: Negative for headache All other ROS negative  ____________________________________________   PHYSICAL EXAM:  VITAL SIGNS: ED Triage Vitals  Enc Vitals Group     BP --      Pulse Rate 08/02/20 1213 88     Resp 08/02/20 1213 (!)  29     Temp --      Temp src --      SpO2 08/02/20 1213 (!) 85 %     Weight 08/02/20 1149 (!) 361 lb (163.7 kg)     Height 08/02/20 1149 5\' 6"  (1.676 m)     Head Circumference --      Peak Flow --      Pain Score 08/02/20 1148 0     Pain Loc --      Pain Edu? --      Excl. in GC? --    Constitutional: Alert and oriented. Well appearing and in no distress. Eyes: Normal exam ENT      Head: Normocephalic and atraumatic.      Mouth/Throat: Mucous membranes are moist. Cardiovascular: Normal rate, regular rhythm. Respiratory: Normal respiratory effort without  tachypnea nor retractions. Breath sounds are clear  Gastrointestinal: Soft and nontender. No distention.  Musculoskeletal: Nontender with normal range of motion in all extremities. No lower extremity tenderness or edema. Neurologic:  Normal speech and language. No gross focal neurologic deficits Skin:  Skin is warm, dry and intact.  Psychiatric: Mood and affect are normal.   ____________________________________________    EKG  EKG viewed and interpreted by myself shows a normal sinus rhythm at 92 bpm with a narrow QRS, normal axis, normal intervals, no concerning ST changes.  ____________________________________________    RADIOLOGY  Chest x-ray shows cardiac enlargement, clear lungs.  ____________________________________________   INITIAL IMPRESSION / ASSESSMENT AND PLAN / ED COURSE  Pertinent labs & imaging results that were available during my care of the patient were reviewed by me and considered in my medical decision making (see chart for details).   Patient presents to the emergency department for shortness of breath/hypoxia and somnolence.  Differential would include elevated PCO2, hypoxia due to pneumonia, Covid, PE, ACS, decreased respiratory drive.  We will check labs, placed on BiPAP, obtain an ABG, chest x-ray, Covid swab and continue to closely monitor.  Patient's x-ray is overall reassuring.  Lab work shows a slight white blood cell count elevation chemistry shows mild glucose as well as LFT elevation.  Mild troponin elevation.  BNP is slightly elevated at 263.  Covid is negative.  ABG shows PCO2 of 79.  Given the patient's hypercarbia I have placed the patient on BiPAP.  We will proceed with CTA imaging to help rule out pulmonary embolism.  Appears to be improving on BiPAP.  Patient will ultimately require admission to the hospital service.  Benjamin Moran was evaluated in Emergency Department on 08/02/2020 for the symptoms described in the history of  present illness. He was evaluated in the context of the global COVID-19 pandemic, which necessitated consideration that the patient might be at risk for infection with the SARS-CoV-2 virus that causes COVID-19. Institutional protocols and algorithms that pertain to the evaluation of patients at risk for COVID-19 are in a state of rapid change based on information released by regulatory bodies including the CDC and federal and state organizations. These policies and algorithms were followed during the patient's care in the ED.   CRITICAL CARE Performed by: 08/04/2020   Total critical care time: 30 minutes  Critical care time was exclusive of separately billable procedures and treating other patients.  Critical care was necessary to treat or prevent imminent or life-threatening deterioration.  Critical care was time spent personally by me on the following activities: development of treatment plan with patient and/or surrogate as  well as nursing, discussions with consultants, evaluation of patient's response to treatment, examination of patient, obtaining history from patient or surrogate, ordering and performing treatments and interventions, ordering and review of laboratory studies, ordering and review of radiographic studies, pulse oximetry and re-evaluation of patient's condition.   ____________________________________________   FINAL CLINICAL IMPRESSION(S) / ED DIAGNOSES  Hypercapnia Hypoxia   Minna Antis, MD 08/02/20 1527

## 2020-08-02 NOTE — ED Notes (Signed)
Pt placed on bipap around 1300

## 2020-08-02 NOTE — Consult Note (Signed)
Name: Benjamin Moran MRN: [id number] DOB: 02-14-1971    ADMISSION DATE:  08/02/2020 CONSULTATION DATE: 08/02/2020  REFERRING MD : Dr. Emmit Pomfret  CHIEF COMPLAINT: Somnolence/Weakness   BRIEF PATIENT DESCRIPTION:  49 yo obese male admitted with acute encephalopathy and acute hypoxic hypercapnic respiratory failure secondary to OSA/OHS requiring Bipap   SIGNIFICANT EVENTS: 12/15: Pt admitted to ICU per hospitalist team, however remained in the ER pending bed availability   STUDIES:  CTA Chest 12/15>>No central pulmonary embolus. The distal segmental and subsegmental branches are not well assessed. Dilatation of the main pulmonary artery suggesting pulmonary arterial hypertension. Slight heterogeneous pulmonary parenchyma, can be seen with small airways disease. Hypoventilatory atelectasis in both lungs. Aortic Atherosclerosis (ICD10-I70.0). CT Head 12/15>>Negative non contrasted CT appearance of the brain  HISTORY OF PRESENT ILLNESS:   This is a 49 yo male with a PMH of Renal Disorder, HTN, and Type II Diabetes Mellitus.  He presented to [facility] ER on 12/15 with shortness of breath, somnolence, and weakness.  Wife reported since 12/14 the pt has been extremely tired; sleeping most of the day; unable to complete sentences before falling right back to sleep; and has been falling asleep while driving.  He does have a hx of OSA and does not have a CPAP machine at home.  He is scheduled for a sleep study, however this has not been completed yet.  In the ER pt hypoxic on RA with O2 sats @30 %, therefore he was placed on 5L O2 with O2 sats increasing to 85%.  He was also noted to be somnolent, but would wake up and speak when stimulated.  COVID-19/Influenza PCR and CXR negative.  CT Chest negative for pulmonary embolism.  Lab results revealed Na+ 134, K+ 5.3, chloride 93, glucose 232, AST 108, ALT 172, BNP 263, troponin 40, wbc 13.6, and ABG pH 7.25/pCO2 98/bicarb 43.0.  Therefore, pt placed  on continuous Bipap.  Heparin gtt also initiated due elevated troponin's.  Pt admitted to ICU per hospitalist team, but remained in the ER pending bed availability.  PCCM team consulted to assist with management.   PAST MEDICAL HISTORY :   has a past medical history of Borderline diabetic, Diabetes mellitus without complication (HCC), Hypertension, and Renal disorder.  has a past surgical history that includes Appendectomy. Prior to Admission medications   Medication Sig Start Date End Date Taking? Authorizing Provider  atorvastatin (LIPITOR) 10 MG tablet Take 10 mg by mouth daily.   Yes [provider]  carvedilol (COREG) 3.125 MG tablet Take 3.125 mg by mouth 2 (two) times daily with a meal.   Yes [provider]  furosemide (LASIX) 40 MG tablet Take 1 tablet (40 mg total) by mouth 2 (two) times daily. 05/27/20 05/27/21 Yes 07/27/21, MD  liraglutide (VICTOZA) 18 MG/3ML SOPN Inject 18 mg into the skin.   Yes [provider]  lisinopril (ZESTRIL) 20 MG tablet Take 20 mg by mouth daily.   Yes [provider]  metFORMIN (GLUCOPHAGE) 500 MG tablet Take 500 mg by mouth 2 (two) times daily with a meal.   Yes [provider]  spironolactone (ALDACTONE) 25 MG tablet Take 25 mg by mouth daily.   Yes [provider]   Allergies  Allergen Reactions  . Penicillins Hives    Other reaction(s): Unknown    FAMILY HISTORY:  family history includes Breast cancer in his cousin. SOCIAL HISTORY:  reports that he quit smoking about 13 years ago. He has never used  smokeless tobacco. He reports that he does not drink alcohol and does not use drugs.  REVIEW OF SYSTEMS:   Unable to assess pt lethargic on Bipap   SUBJECTIVE:  Unable to assess pt lethargic on Bipap   VITAL SIGNS: Temp:  [98.5 F (36.9 C)-99.4 F (37.4 C)] 98.5 F (36.9 C) (12/15 1542) Pulse Rate:  [70-89] 72 (12/15 2300) Resp:  [18-29] 20 (12/15 2300) BP: (120-142)/(76-97)  133/92 (12/15 2300) SpO2:  [85 %-100 %] 98 % (12/15 2300) Weight:  [163.7 kg] 163.7 kg (12/15 1149)  PHYSICAL EXAMINATION: General: acutely ill appearing obese male, NAD on Bipap  Neuro: somnolent, follows commands and occasionally speaks when stimulated, PERRL HEENT: large neck unable to assess for JVD  Cardiovascular: nsr, rrr, no R/G, 2+ radial pulses, 1+ distal pulses, 2+ bilateral lower extremity edema  Lungs: diminished throughout, even, non labored  Abdomen: +BS x4, obese, soft, non tender, non distended  Musculoskeletal: moves all extremities  Skin: intact no rashes or lesions present   Recent Labs  Lab 08/02/20 1216  NA 134*  K 5.3*  CL 93*  CO2 32  BUN 22*  CREATININE 1.13  GLUCOSE 232*   Recent Labs  Lab 08/02/20 1216  HGB 15.3  HCT 51.2  WBC 13.6*  PLT 343   CT HEAD WO CONTRAST  Result Date: 08/02/2020 CLINICAL DATA:  Mental status change EXAM: CT HEAD WITHOUT CONTRAST TECHNIQUE: Contiguous axial images were obtained from the base of the skull through the vertex without intravenous contrast. COMPARISON:  None. FINDINGS: Brain: No evidence of acute infarction, hemorrhage, hydrocephalus, extra-axial collection or mass lesion/mass effect. Vascular: No hyperdense vessel or unexpected calcification. Skull: Normal. Negative for fracture or focal lesion. Sinuses/Orbits: No acute finding. Other: None. IMPRESSION: Negative non contrasted CT appearance of the brain. Electronically Signed   By: Jasmine Pang M.D.   On: 08/02/2020 19:41   CT Angio Chest PE W and/or Wo Contrast  Result Date: 08/02/2020 CLINICAL DATA:  PE suspected, high prob Hypoxia. EXAM: CT ANGIOGRAPHY CHEST WITH CONTRAST TECHNIQUE: Multidetector CT imaging of the chest was performed using the standard protocol during bolus administration of intravenous contrast. Multiplanar CT image reconstructions and MIPs were obtained to evaluate the vascular anatomy. CONTRAST:  62mL OMNIPAQUE IOHEXOL 350 MG/ML SOLN  COMPARISON:  Radiograph earlier today. FINDINGS: Cardiovascular: Technically limited exam due to soft tissue attenuate patient from habitus and contrast bolus timing. Evaluation is diagnostic to the proximal segmental levels. There are no filling defects within the pulmonary arteries to suggest pulmonary embolus. The distal segmental and subsegmental branches are not well assessed. Dilatation of the main pulmonary artery at 3.7 cm. Mild aortic atherosclerosis. No aortic aneurysm. No evidence of dissection allowing for phase of contrast tailored to pulmonary artery evaluation. Borderline cardiomegaly. No pericardial effusion. Mediastinum/Nodes: No enlarged mediastinal or hilar lymph nodes. There is mediastinal lipomatosis. Heterogeneous thyroid gland with a punctate calcification in the left lobe, no visualized focal nodule. Lungs/Pleura: Dependent opacities in both lower lungs typical of hypoventilatory atelectasis. There is mild compressive atelectasis in the right middle lobe adjacent to elevated right hemidiaphragm. Slight heterogeneous pulmonary parenchyma. No confluent consolidation. No septal thickening. No pulmonary mass or evident nodule. No pleural fluid. Upper Abdomen: Enlarged liver with steatosis, partially included. No acute findings in the upper abdomen. Musculoskeletal: Lower thoracic spondylosis. There are no acute or suspicious osseous abnormalities. Review of the MIP images confirms the above findings. IMPRESSION: 1. No central pulmonary embolus. The distal segmental and subsegmental branches are not  well assessed. 2. Dilatation of the main pulmonary artery suggesting pulmonary arterial hypertension. 3. Slight heterogeneous pulmonary parenchyma, can be seen with small airways disease. Hypoventilatory atelectasis in both lungs. Aortic Atherosclerosis (ICD10-I70.0). Electronically Signed   By: Narda Rutherford M.D.   On: 08/02/2020 15:47   DG Chest Portable 1 View  Result Date:  08/02/2020 CLINICAL DATA:  Shortness of breath EXAM: PORTABLE CHEST 1 VIEW COMPARISON:  May 27, 2020 FINDINGS: The lungs are clear. Heart is borderline enlarged with pulmonary vascularity normal. No adenopathy. No bone lesions. IMPRESSION: Borderline cardiac enlargement.  Lungs clear. Electronically Signed   By: Bretta Bang III M.D.   On: 08/02/2020 12:48    ASSESSMENT / PLAN:  Acute on chronic hypoxic hypercapnic respiratory failure secondary to OSA/OHS  Acute encephalopathy secondary to hypercapnia and hypoxia  Prn supplemental O2 or Bipap for dyspnea, hypercapnia, and/or hypoxia Bipap qhs (will require Bipap or CPAP at discharge for home use) Will need outpatient sleep study  Avoid sedating medications Frequent reorientation  Encourage weight loss  Will check ammonia level due to elevated liver enzymes  Repeat ABG pending in the am  Trend WBC and monitor fever curve-no obvious signs of infection at this time   Sonda Rumble, Juel Burrow  Pulmonary/Critical Care Pager [phone] (please enter 7 digits) PCCM Consult Pager [phone] (please enter 7 digits)

## 2020-08-03 ENCOUNTER — Inpatient Hospital Stay (HOSPITAL_COMMUNITY)
Admit: 2020-08-03 | Discharge: 2020-08-03 | Disposition: A | Discharge status: Home / Self Care | Payer: Medicaid Other | Attending: Family Medicine | Admitting: Family Medicine

## 2020-08-03 ENCOUNTER — Other Ambulatory Visit: Payer: Self-pay

## 2020-08-03 DIAGNOSIS — I214 Non-ST elevation (NSTEMI) myocardial infarction: Secondary | ICD-10-CM

## 2020-08-03 DIAGNOSIS — E1169 Type 2 diabetes mellitus with other specified complication: Secondary | ICD-10-CM

## 2020-08-03 DIAGNOSIS — I10 Essential (primary) hypertension: Secondary | ICD-10-CM

## 2020-08-03 LAB — BLOOD GAS, ARTERIAL
Acid-Base Excess: 11 mmol/L — ABNORMAL HIGH (ref 0.0–2.0)
Bicarbonate: 40.6 mmol/L — ABNORMAL HIGH (ref 20.0–28.0)
Delivery systems: POSITIVE
Expiratory PAP: 8
FIO2: 0.45
Inspiratory PAP: 14
O2 Saturation: 96.1 %
Patient temperature: 37
pCO2 arterial: 77 mmHg (ref 32.0–48.0)
pH, Arterial: 7.33 — ABNORMAL LOW (ref 7.350–7.450)
pO2, Arterial: 88 mmHg (ref 83.0–108.0)

## 2020-08-03 LAB — ECHOCARDIOGRAM COMPLETE
AR max vel: 3.75 cm2
AV Area VTI: 3.99 cm2
AV Area mean vel: 3.69 cm2
AV Mean grad: 4 mmHg
AV Peak grad: 9.2 mmHg
Ao pk vel: 1.52 m/s
Area-P 1/2: 3.21 cm2
Calc EF: 66.8 %
Height: 66 in
S' Lateral: 3.04 cm
Single Plane A2C EF: 66.4 %
Single Plane A4C EF: 65.9 %
Weight: 5776 oz

## 2020-08-03 LAB — GLUCOSE, CAPILLARY
Glucose-Capillary: 103 mg/dL — ABNORMAL HIGH (ref 70–99)
Glucose-Capillary: 103 mg/dL — ABNORMAL HIGH (ref 70–99)
Glucose-Capillary: 104 mg/dL — ABNORMAL HIGH (ref 70–99)
Glucose-Capillary: 142 mg/dL — ABNORMAL HIGH (ref 70–99)
Glucose-Capillary: 176 mg/dL — ABNORMAL HIGH (ref 70–99)

## 2020-08-03 LAB — BASIC METABOLIC PANEL
Anion gap: 9 (ref 5–15)
BUN: 19 mg/dL (ref 6–20)
CO2: 34 mmol/L — ABNORMAL HIGH (ref 22–32)
Calcium: 8.1 mg/dL — ABNORMAL LOW (ref 8.9–10.3)
Chloride: 95 mmol/L — ABNORMAL LOW (ref 98–111)
Creatinine, Ser: 0.94 mg/dL (ref 0.61–1.24)
GFR, Estimated: >60 mL/min (ref >60)
Glucose, Bld: 108 mg/dL — ABNORMAL HIGH (ref 70–99)
Potassium: 4.8 mmol/L (ref 3.5–5.1)
Sodium: 138 mmol/L (ref 135–145)

## 2020-08-03 LAB — CBC
HCT: 48.3 % (ref 39.0–52.0)
Hemoglobin: 14.5 g/dL (ref 13.0–17.0)
MCH: 26.3 pg (ref 26.0–34.0)
MCHC: 30 g/dL (ref 30.0–36.0)
MCV: 87.5 fL (ref 80.0–100.0)
Platelets: 318 10*3/uL (ref 150–400)
RBC: 5.52 MIL/uL (ref 4.22–5.81)
RDW: 15.3 % (ref 11.5–15.5)
WBC: 13.6 10*3/uL — ABNORMAL HIGH (ref 4.0–10.5)
nRBC: 0.4 % — ABNORMAL HIGH (ref 0.0–0.2)

## 2020-08-03 LAB — HEPARIN LEVEL (UNFRACTIONATED)
Heparin Unfractionated: 0.11 IU/mL — ABNORMAL LOW (ref 0.30–0.70)
Heparin Unfractionated: 0.37 IU/mL (ref 0.30–0.70)
Heparin Unfractionated: 0.31 IU/mL (ref 0.30–0.70)

## 2020-08-03 LAB — PROTIME-INR
INR: 1.1 (ref 0.8–1.2)
Prothrombin Time: 13.5 seconds (ref 11.4–15.2)

## 2020-08-03 LAB — URINE DRUG SCREEN, QUALITATIVE (ARMC ONLY)
Amphetamines, Ur Screen: NOT DETECTED
Barbiturates, Ur Screen: NOT DETECTED
Benzodiazepine, Ur Scrn: NOT DETECTED
Cannabinoid 50 Ng, Ur ~~LOC~~: NOT DETECTED
Cocaine Metabolite,Ur ~~LOC~~: NOT DETECTED
MDMA (Ecstasy)Ur Screen: NOT DETECTED
Methadone Scn, Ur: NOT DETECTED
Opiate, Ur Screen: NOT DETECTED
Phencyclidine (PCP) Ur S: NOT DETECTED
Tricyclic, Ur Screen: NOT DETECTED

## 2020-08-03 LAB — CBG MONITORING, ED
Glucose-Capillary: 121 mg/dL — ABNORMAL HIGH (ref 70–99)
Glucose-Capillary: 105 mg/dL — ABNORMAL HIGH (ref 70–99)
Glucose-Capillary: 100 mg/dL — ABNORMAL HIGH (ref 70–99)

## 2020-08-03 LAB — HEMOGLOBIN A1C
Hgb A1c MFr Bld: 8 % — ABNORMAL HIGH (ref 4.8–5.6)
Mean Plasma Glucose: 182.9 mg/dL

## 2020-08-03 LAB — MRSA PCR SCREENING: MRSA by PCR: NEGATIVE

## 2020-08-03 LAB — AMMONIA: Ammonia: 21 umol/L (ref 9–35)

## 2020-08-03 LAB — HIV ANTIBODY (ROUTINE TESTING W REFLEX): HIV Screen 4th Generation wRfx: NONREACTIVE

## 2020-08-03 MED ORDER — NITROGLYCERIN 0.4 MG SL SUBL: 0.4000 mg | SUBLINGUAL_TABLET | SUBLINGUAL | Status: DC | PRN

## 2020-08-03 MED ORDER — CARVEDILOL 3.125 MG PO TABS
3.1250 mg | ORAL_TABLET | Freq: Two times a day (BID) | ORAL | Status: DC
  Administered 2020-08-03 – 2020-08-05 (×6): 3.125 mg via ORAL
  Filled 2020-08-03 (×6): qty 1

## 2020-08-03 MED ORDER — ACETAMINOPHEN 325 MG PO TABS: 650.0000 mg | ORAL_TABLET | ORAL | Status: DC | PRN

## 2020-08-03 MED ORDER — PNEUMOCOCCAL VAC POLYVALENT 25 MCG/0.5ML IJ INJ: 0.5000 mL | INJECTION | INTRAMUSCULAR | Status: DC

## 2020-08-03 MED ORDER — PERFLUTREN LIPID MICROSPHERE
1.0000 mL | INTRAVENOUS | Status: AC | PRN
  Administered 2020-08-03: 10:00:00 2 mL via INTRAVENOUS
  Filled 2020-08-03: qty 10

## 2020-08-03 MED ORDER — CHLORHEXIDINE GLUCONATE 0.12 % MT SOLN
15.0000 mL | Freq: Two times a day (BID) | OROMUCOSAL | Status: DC
  Administered 2020-08-03 – 2020-08-05 (×5): 15 mL via OROMUCOSAL
  Filled 2020-08-03 (×4): qty 15

## 2020-08-03 MED ORDER — INSULIN ASPART 100 UNIT/ML ~~LOC~~ SOLN
0.0000 [IU] | SUBCUTANEOUS | Status: DC
  Administered 2020-08-03: 13:00:00 3 [IU] via SUBCUTANEOUS
  Administered 2020-08-03 – 2020-08-04 (×3): 2 [IU] via SUBCUTANEOUS
  Filled 2020-08-03 (×4): qty 1

## 2020-08-03 MED ORDER — ASPIRIN EC 81 MG PO TBEC
81.0000 mg | DELAYED_RELEASE_TABLET | Freq: Every day | ORAL | Status: DC
  Administered 2020-08-03 – 2020-08-05 (×3): 81 mg via ORAL
  Filled 2020-08-03 (×3): qty 1

## 2020-08-03 MED ORDER — HEPARIN BOLUS VIA INFUSION
3100.0000 [IU] | Freq: Once | INTRAVENOUS | Status: AC
  Administered 2020-08-03: 05:00:00 3100 [IU] via INTRAVENOUS
  Filled 2020-08-03: qty 3100

## 2020-08-03 MED ORDER — SPIRONOLACTONE 25 MG PO TABS
25.0000 mg | ORAL_TABLET | Freq: Every day | ORAL | Status: DC
  Administered 2020-08-03 – 2020-08-05 (×3): 25 mg via ORAL
  Filled 2020-08-03 (×4): qty 1

## 2020-08-03 MED ORDER — CHLORHEXIDINE GLUCONATE CLOTH 2 % EX PADS
6.0000 | MEDICATED_PAD | Freq: Every day | CUTANEOUS | Status: DC
  Administered 2020-08-03: 09:00:00 6 via TOPICAL

## 2020-08-03 MED ORDER — LISINOPRIL 20 MG PO TABS
20.0000 mg | ORAL_TABLET | Freq: Every day | ORAL | Status: DC
  Administered 2020-08-03 – 2020-08-05 (×3): 20 mg via ORAL
  Filled 2020-08-03 (×3): qty 1

## 2020-08-03 MED ORDER — FUROSEMIDE 40 MG PO TABS
40.0000 mg | ORAL_TABLET | Freq: Two times a day (BID) | ORAL | Status: DC
  Administered 2020-08-03 (×2): 40 mg via ORAL
  Filled 2020-08-03 (×2): qty 2

## 2020-08-03 MED ORDER — ORAL CARE MOUTH RINSE
15.0000 mL | Freq: Two times a day (BID) | OROMUCOSAL | Status: DC
  Administered 2020-08-03 – 2020-08-05 (×3): 15 mL via OROMUCOSAL

## 2020-08-03 MED ORDER — ONDANSETRON HCL 4 MG/2ML IJ SOLN: 4.0000 mg | Freq: Four times a day (QID) | INTRAMUSCULAR | Status: DC | PRN

## 2020-08-03 MED ORDER — ATORVASTATIN CALCIUM 10 MG PO TABS
10.0000 mg | ORAL_TABLET | Freq: Every day | ORAL | Status: DC
  Administered 2020-08-03 – 2020-08-05 (×3): 10 mg via ORAL
  Filled 2020-08-03 (×3): qty 1

## 2020-08-03 NOTE — Progress Notes (Signed)
ANTICOAGULATION CONSULT NOTE - Initial Consult  Pharmacy Consult for Heparin Indication: chest pain/ACS  Allergies  Allergen Reactions  . Penicillins Hives    Other reaction(s): Unknown    Patient Measurements: Height: 5\' 6"  (167.6 cm) Weight: (!) 163.7 kg (361 lb) IBW/kg (Calculated) : 63.8 Heparin Dosing Weight: 104.9 kg  Vital Signs: BP: 101/46 (12/16 0400) Pulse Rate: 78 (12/16 0400)  Labs: Recent Labs    08/02/20 1215 08/02/20 1216 08/02/20 1440 08/02/20 1836 08/03/20 0403  HGB  --  15.3  --   --  14.5  HCT  --  51.2  --   --  48.3  PLT  --  343  --   --  318  APTT  --   --   --  32  --   LABPROT 13.5  --   --   --  13.5  INR 1.1  --   --   --  1.1  HEPARINUNFRC  --   --   --   --  0.11*  CREATININE  --  1.13  --   --  0.94  TROPONINIHS  --  40* 107*  --   --     Estimated Creatinine Clearance: 136.5 mL/min (by C-G formula based on SCr of 0.94 mg/dL).   Medical History: Past Medical History:  Diagnosis Date  . Borderline diabetic   . Diabetes mellitus without complication (HCC)   . Hypertension   . Renal disorder     Assessment: Patient is a 49yo male presenting with shortness of breath and weakness. Troponin is trending up. Pharmacy consulted for Heparin dosing. No prior anticoagulants listed in home meds. Baseline labs are acceptable.  Goal of Therapy:  Heparin level 0.3-0.7 units/ml Monitor platelets by anticoagulation protocol: Yes   Plan:  12/16:  HL @ 0403 = 0.11 Will order Heparin 3100 units IV X 1 and increase drip rate to 1850 units/hr.  Will recheck HL 6 hrs after start of drip.  Ori Trejos D 08/03/2020 4:58 AM

## 2020-08-03 NOTE — Progress Notes (Signed)
ANTICOAGULATION CONSULT NOTE - Initial Consult  Pharmacy Consult for Heparin Indication: chest pain/ACS  Allergies  Allergen Reactions  . Penicillins Hives    Other reaction(s): Unknown    Patient Measurements: Height: 5\' 6"  (167.6 cm) Weight: (!) 163.7 kg (361 lb) IBW/kg (Calculated) : 63.8 Heparin Dosing Weight: 104.9 kg  Vital Signs: Temp: 98.6 F (37 C) (12/16 1600) Temp Source: Axillary (12/16 1600) BP: 105/66 (12/16 1700) Pulse Rate: 63 (12/16 1700)  Labs: Recent Labs    08/02/20 1215 08/02/20 1216 08/02/20 1440 08/02/20 1836 08/03/20 0403 08/03/20 1055 08/03/20 1705  HGB  --  15.3  --   --  14.5  --   --   HCT  --  51.2  --   --  48.3  --   --   PLT  --  343  --   --  318  --   --   APTT  --   --   --  32  --   --   --   LABPROT 13.5  --   --   --  13.5  --   --   INR 1.1  --   --   --  1.1  --   --   HEPARINUNFRC  --   --   --   --  0.11* 0.37 0.31  CREATININE  --  1.13  --   --  0.94  --   --   TROPONINIHS  --  40* 107*  --   --   --   --     Estimated Creatinine Clearance: 136.5 mL/min (by C-G formula based on SCr of 0.94 mg/dL).   Medical History: Past Medical History:  Diagnosis Date  . Borderline diabetic   . Diabetes mellitus without complication (HCC)   . Hypertension   . Renal disorder     Assessment: Patient is a 49yo male presenting with shortness of breath and weakness. PMH includes diabetes, obesity, HTN, and HLD. Troponin is trending up 40>>107. O2 sats on admission 30% - pt now on BiPAP. ECG showing ST abnormality. Pharmacy consulted for Heparin dosing. No prior anticoagulants listed in home meds. CBC WNL.  12/16:  HL @ 0403 = 0.11, 3100 units IV X 1 given and increased drip rate to 1850 units/hr 12/16 1055 HL = 0.37, therapeutic x1 12/16 1705 HL = 0.31, therapeutic x2  Goal of Therapy:  Heparin level 0.3-0.7 units/ml Monitor platelets by anticoagulation protocol: Yes   Plan:   HL therapeutic x 2, continue current heparin  rate of 1850 units/hr  Will recheck HL and CBC in AM   1/17, PharmD Pharmacy Resident  08/03/2020 6:02 PM

## 2020-08-03 NOTE — Progress Notes (Signed)
ANTICOAGULATION CONSULT NOTE - Initial Consult  Pharmacy Consult for Heparin Indication: chest pain/ACS  Allergies  Allergen Reactions  . Penicillins Hives    Other reaction(s): Unknown    Patient Measurements: Height: 5\' 6"  (167.6 cm) Weight: (!) 163.7 kg (361 lb) IBW/kg (Calculated) : 63.8 Heparin Dosing Weight: 104.9 kg  Vital Signs: Temp: 98.7 F (37.1 C) (12/16 1200) Temp Source: Axillary (12/16 1200) BP: 109/61 (12/16 1400) Pulse Rate: 77 (12/16 1400)  Labs: Recent Labs    08/02/20 1215 08/02/20 1216 08/02/20 1440 08/02/20 1836 08/03/20 0403 08/03/20 1055  HGB  --  15.3  --   --  14.5  --   HCT  --  51.2  --   --  48.3  --   PLT  --  343  --   --  318  --   APTT  --   --   --  32  --   --   LABPROT 13.5  --   --   --  13.5  --   INR 1.1  --   --   --  1.1  --   HEPARINUNFRC  --   --   --   --  0.11* 0.37  CREATININE  --  1.13  --   --  0.94  --   TROPONINIHS  --  40* 107*  --   --   --     Estimated Creatinine Clearance: 136.5 mL/min (by C-G formula based on SCr of 0.94 mg/dL).   Medical History: Past Medical History:  Diagnosis Date  . Borderline diabetic   . Diabetes mellitus without complication (HCC)   . Hypertension   . Renal disorder     Assessment: Patient is a 49yo male presenting with shortness of breath and weakness. Troponin is trending up. Pharmacy consulted for Heparin dosing. No prior anticoagulants listed in home meds. Baseline labs are acceptable.  12/16:  HL @ 0403 = 0.11, 3100 units IV X 1 given and increased drip rate to 1850 units/hr 12/16 1055 HL = 0.37, therapeutic x1  Goal of Therapy:  Heparin level 0.3-0.7 units/ml Monitor platelets by anticoagulation protocol: Yes     Plan:   HL therapeutic x 1, will continue heparin at current rate   Will recheck HL 6 hrs   1/17, PharmD, BCPS 08/03/2020 2:40 PM

## 2020-08-03 NOTE — Progress Notes (Signed)
*  PRELIMINARY RESULTS* Echocardiogram 2D Echocardiogram has been performed.  Benjamin Moran 08/03/2020, 10:27 AM

## 2020-08-03 NOTE — ED Notes (Signed)
Respiratory contacted for transport of pt to ICU.

## 2020-08-03 NOTE — ED Notes (Signed)
Late entry -- Pt resting with eyes closed but easily aroused with verbal stimulation.  Oriented to self and able to answer some closed ended questions.  Pt currently on BiPaP 07/26/34%.  Abdomen round and slightly firm.  BLE discoloration with +1 pitting edema to RLE and +3 pitting edema to LLE.  Will monitor for acute changes and maintain plan of care.

## 2020-08-03 NOTE — Consult Note (Signed)
CARDIOLOGY CONSULT NOTE               Patient ID: Benjamin Moran MRN: [id number] DOB/AGE: 1971/02/20 49 y.o.  Admit date: 08/02/2020 Referring Physician Tonye Royalty DO Primary Physician [facility] Primary Cardiologist  Reason for Consultation respiratory failure shortness of breath elevated troponin  HPI: Patient is a morbidly obese 49 year old Hispanic male presents with severe dyspnea shortness of breath and encephalopathy with severe hypoxemia patient recently diagnosed with sepsis sleep apnea but has not received his machine reportedly became more hypoxic confused sats were as low as 30% eventually was brought in by rescue placed on BiPAP which seemed to improve his symptoms work-up for other indications for altered mental status were entertained no clear evidence of sepsis no fever chills or sweats patient was placed in intensive care unit for treatment of diabetes hypertension and respiratory status patient had mildly borderline elevation of troponins but no cardiac type symptoms except for heart failure  Review of systems complete and found to be negative unless listed above     Past Medical History:  Diagnosis Date  . Borderline diabetic   . Diabetes mellitus without complication (HCC)   . Hypertension   . Renal disorder     Past Surgical History:  Procedure Laterality Date  . APPENDECTOMY      Medications Prior to Admission  Medication Sig Dispense Refill Last Dose  . atorvastatin (LIPITOR) 10 MG tablet Take 10 mg by mouth daily.   08/01/2020 at Unknown time  . carvedilol (COREG) 3.125 MG tablet Take 3.125 mg by mouth 2 (two) times daily with a meal.   08/01/2020 at Unknown time  . furosemide (LASIX) 40 MG tablet Take 1 tablet (40 mg total) by mouth 2 (two) times daily. 30 tablet 1 08/01/2020 at Unknown time  . liraglutide (VICTOZA) 18 MG/3ML SOPN Inject 18 mg into the skin.   08/01/2020 at Unknown time  . lisinopril  (ZESTRIL) 20 MG tablet Take 20 mg by mouth daily.   08/01/2020 at Unknown time  . metFORMIN (GLUCOPHAGE) 500 MG tablet Take 500 mg by mouth 2 (two) times daily with a meal.   08/01/2020 at Unknown time  . spironolactone (ALDACTONE) 25 MG tablet Take 25 mg by mouth daily.   08/01/2020 at Unknown time   Social History   Socioeconomic History  . Marital status: Married    Spouse name: Not on file  . Number of children: Not on file  . Years of education: Not on file  . Highest education level: Not on file  Occupational History  . Not on file  Tobacco Use  . Smoking status: Former Smoker    Quit date: 08/19/2006    Years since quitting: 13.9  . Smokeless tobacco: Never Used  Substance and Sexual Activity  . Alcohol use: No  . Drug use: No  . Sexual activity: Not on file  Other Topics Concern  . Not on file  Social History Narrative  . Not on file   Social Determinants of Health   Financial Resource Strain: Not on file  Food Insecurity: Not on file  Transportation Needs: Not on file  Physical Activity: Not on file  Stress: Not on file  Social Connections: Not on file  Intimate Partner Violence: Not on file    Family History  Problem Relation Age of Onset  . Breast cancer Cousin       Review of systems complete and found to be negative unless listed  above      PHYSICAL EXAM  General: Well developed, well nourished, in no acute distress HEENT:  Normocephalic and atramatic Neck:  No JVD.  Lungs: Clear bilaterally to auscultation and percussion. Heart: HRRR . Normal S1 and S2 without gallops or murmurs.  Abdomen: Bowel sounds are positive, abdomen soft and non-tender  Msk:  Back normal, normal gait. Normal strength and tone for age. Extremities: No clubbing, cyanosis or edema.   Neuro: Alert and oriented X 3. Psych:  Good affect, responds appropriately  Labs:   Lab Results  Component Value Date   WBC 13.6 (H) 08/03/2020   HGB 14.5 08/03/2020   HCT 48.3  08/03/2020   MCV 87.5 08/03/2020   PLT 318 08/03/2020    Recent Labs  Lab 08/02/20 1216 08/03/20 0403  NA 134* 138  K 5.3* 4.8  CL 93* 95*  CO2 32 34*  BUN 22* 19  CREATININE 1.13 0.94  CALCIUM 8.2* 8.1*  PROT 7.7  --   BILITOT 0.6  --   ALKPHOS 91  --   ALT 172*  --   AST 108*  --   GLUCOSE 232* 108*   Lab Results  Component Value Date   CKTOTAL 3,772 (H) 04/23/2012   CKMB 3.0 04/23/2012   TROPONINI 0.07 (H) 04/23/2012   No results found for: CHOL No results found for: HDL No results found for: LDLCALC No results found for: TRIG No results found for: CHOLHDL No results found for: LDLDIRECT    Radiology: CT HEAD WO CONTRAST  Result Date: 08/02/2020 CLINICAL DATA:  Mental status change EXAM: CT HEAD WITHOUT CONTRAST TECHNIQUE: Contiguous axial images were obtained from the base of the skull through the vertex without intravenous contrast. COMPARISON:  None. FINDINGS: Brain: No evidence of acute infarction, hemorrhage, hydrocephalus, extra-axial collection or mass lesion/mass effect. Vascular: No hyperdense vessel or unexpected calcification. Skull: Normal. Negative for fracture or focal lesion. Sinuses/Orbits: No acute finding. Other: None. IMPRESSION: Negative non contrasted CT appearance of the brain. Electronically Signed   By: Jasmine PangKim  Fujinaga M.D.   On: 08/02/2020 19:41   CT Angio Chest PE W and/or Wo Contrast  Result Date: 08/02/2020 CLINICAL DATA:  PE suspected, high prob Hypoxia. EXAM: CT ANGIOGRAPHY CHEST WITH CONTRAST TECHNIQUE: Multidetector CT imaging of the chest was performed using the standard protocol during bolus administration of intravenous contrast. Multiplanar CT image reconstructions and MIPs were obtained to evaluate the vascular anatomy. CONTRAST:  75mL OMNIPAQUE IOHEXOL 350 MG/ML SOLN COMPARISON:  Radiograph earlier today. FINDINGS: Cardiovascular: Technically limited exam due to soft tissue attenuate patient from habitus and contrast bolus timing.  Evaluation is diagnostic to the proximal segmental levels. There are no filling defects within the pulmonary arteries to suggest pulmonary embolus. The distal segmental and subsegmental branches are not well assessed. Dilatation of the main pulmonary artery at 3.7 cm. Mild aortic atherosclerosis. No aortic aneurysm. No evidence of dissection allowing for phase of contrast tailored to pulmonary artery evaluation. Borderline cardiomegaly. No pericardial effusion. Mediastinum/Nodes: No enlarged mediastinal or hilar lymph nodes. There is mediastinal lipomatosis. Heterogeneous thyroid gland with a punctate calcification in the left lobe, no visualized focal nodule. Lungs/Pleura: Dependent opacities in both lower lungs typical of hypoventilatory atelectasis. There is mild compressive atelectasis in the right middle lobe adjacent to elevated right hemidiaphragm. Slight heterogeneous pulmonary parenchyma. No confluent consolidation. No septal thickening. No pulmonary mass or evident nodule. No pleural fluid. Upper Abdomen: Enlarged liver with steatosis, partially included. No acute findings in the  upper abdomen. Musculoskeletal: Lower thoracic spondylosis. There are no acute or suspicious osseous abnormalities. Review of the MIP images confirms the above findings. IMPRESSION: 1. No central pulmonary embolus. The distal segmental and subsegmental branches are not well assessed. 2. Dilatation of the main pulmonary artery suggesting pulmonary arterial hypertension. 3. Slight heterogeneous pulmonary parenchyma, can be seen with small airways disease. Hypoventilatory atelectasis in both lungs. Aortic Atherosclerosis (ICD10-I70.0). Electronically Signed   By: Narda Rutherford M.D.   On: 08/02/2020 15:47   DG Chest Portable 1 View  Result Date: 08/02/2020 CLINICAL DATA:  Shortness of breath EXAM: PORTABLE CHEST 1 VIEW COMPARISON:  May 27, 2020 FINDINGS: The lungs are clear. Heart is borderline enlarged with pulmonary  vascularity normal. No adenopathy. No bone lesions. IMPRESSION: Borderline cardiac enlargement.  Lungs clear. Electronically Signed   By: Bretta Bang III M.D.   On: 08/02/2020 12:48   ECHOCARDIOGRAM COMPLETE  Result Date: 07/17/2020    ECHOCARDIOGRAM REPORT   Patient Name:   Benjamin Moran Date of Exam: 07/17/2020 Medical Rec #:  [id number]                   Height:       66.0 in Accession #:    [id number]                  Weight:       358.0 lb Date of Birth:  08-12-1971                    BSA:          2.561 m Patient Age:    49 years                    BP:           135/74 mmHg Patient Gender: M                           HR:           97 bpm. Exam Location:  [facility] Procedure: 2D Echo, Cardiac Doppler and Color Doppler Indications:     Congestive Heart Failure 428.0  History:         Patient has no prior history of Echocardiogram examinations.                  Risk Factors:Hypertension and Diabetes.  Sonographer:     Cristela Blue RDCS (AE) Referring Phys:  [id number] Delma Freeze Diagnosing Phys: Arnoldo Hooker MD  Sonographer Comments: No apical window, no subcostal window and Technically challenging study due to limited acoustic windows. IMPRESSIONS  1. Left ventricular ejection fraction, by estimation, is 55 to 60%. The left ventricle has normal function. The left ventricle has no regional wall motion abnormalities. Left ventricular diastolic parameters were normal.  2. Right ventricular systolic function is normal. The right ventricular size is normal.  3. The mitral valve is normal in structure. Trivial mitral valve regurgitation.  4. The aortic valve is normal in structure. Aortic valve regurgitation is not visualized. FINDINGS  Left Ventricle: Left ventricular ejection fraction, by estimation, is 55 to 60%. The left ventricle has normal function. The left ventricle has no regional wall motion abnormalities. The left ventricular internal cavity size was normal in size. There is  no left  ventricular hypertrophy. Left ventricular diastolic parameters were normal. Right Ventricle: The right ventricular size is normal. No increase in  right ventricular wall thickness. Right ventricular systolic function is normal. Left Atrium: Left atrial size was normal in size. Right Atrium: Right atrial size was normal in size. Pericardium: There is no evidence of pericardial effusion. Mitral Valve: The mitral valve is normal in structure. Trivial mitral valve regurgitation. Tricuspid Valve: The tricuspid valve is normal in structure. Tricuspid valve regurgitation is trivial. Aortic Valve: The aortic valve is normal in structure. Aortic valve regurgitation is not visualized. Pulmonic Valve: The pulmonic valve was normal in structure. Pulmonic valve regurgitation is not visualized. Aorta: The aortic root and ascending aorta are structurally normal, with no evidence of dilitation. IAS/Shunts: No atrial level shunt detected by color flow Doppler.  LEFT VENTRICLE PLAX 2D LVIDd:         4.67 cm LVIDs:         3.23 cm LV PW:         1.25 cm LV IVS:        1.49 cm LVOT diam:     2.00 cm LVOT Area:     3.14 cm  LEFT ATRIUM         Index LA diam:    4.70 cm 1.84 cm/m                        PULMONIC VALVE AORTA                 PV Vmax:        0.74 m/s Ao Root diam: 3.00 cm PV Peak grad:   2.2 mmHg                       RVOT Peak grad: 3 mmHg   SHUNTS Systemic Diam: 2.00 cm Arnoldo Hooker MD Electronically signed by Arnoldo Hooker MD Signature Date/Time: 07/17/2020/12:07:39 PM    Final    Echo preserved left ventricular function ejection fraction of 60% with evidence of diastolic dysfunction  EKG: Normal sinus rhythm nonspecific ST-T wave changes rate of 75  ASSESSMENT AND PLAN:  Acute respiratory failure Hypoxemia Morbid obesity Obstructive sleep apnea Diabetes Congestive heart failure diastolic dysfunction Hypertension Elevated troponin/demand ischemia Hyperlipidemia Shortness of breath . Plan Agree with  admit to ICU for respiratory failure Recommend supportive respiratory care with supplemental oxygen inhalers Consider broad-spectrum antibiotic therapy for possible orchitis Diuretic therapy for heart failure BiPAP or CPAP for respiratory failure Continue diabetes management and control with insulin Short-term anticoagulation therapy mainly for DVT prophylaxis This does not appear to represent an acute coronary syndrome and troponins are probably demand ischemia Encephalopathy probably related to hypoxemia Signed: Alwyn Pea MD 08/03/2020, 11:04 AM

## 2020-08-03 NOTE — Plan of Care (Signed)
Patient admitted from ED and immediately transitioned from BiPAP to 4L [place].     Problem: Education: Goal: Knowledge of General Education information will improve Description: Including pain rating scale, medication(s)/side effects and non-pharmacologic comfort measures Outcome: Progressing   Problem: Health Behavior/Discharge Planning: Goal: Ability to manage health-related needs will improve Outcome: Progressing   Problem: Clinical Measurements: Goal: Ability to maintain clinical measurements within normal limits will improve Outcome: Progressing Goal: Will remain free from infection Outcome: Progressing Goal: Diagnostic test results will improve Outcome: Progressing Goal: Respiratory complications will improve Outcome: Progressing Goal: Cardiovascular complication will be avoided Outcome: Progressing   Problem: Activity: Goal: Risk for activity intolerance will decrease Outcome: Progressing   Problem: Nutrition: Goal: Adequate nutrition will be maintained Outcome: Progressing   Problem: Coping: Goal: Level of anxiety will decrease Outcome: Progressing   Problem: Elimination: Goal: Will not experience complications related to bowel motility Outcome: Progressing Goal: Will not experience complications related to urinary retention Outcome: Progressing   Problem: Pain Managment: Goal: General experience of comfort will improve Outcome: Progressing   Problem: Safety: Goal: Ability to remain free from injury will improve Outcome: Progressing   Problem: Skin Integrity: Goal: Risk for impaired skin integrity will decrease Outcome: Progressing

## 2020-08-03 NOTE — Progress Notes (Addendum)
PROGRESS NOTE    Benjamin Moran  ZOX:[id number] DOB: 01/13/1971 DOA: 08/02/2020 [facility]    Assessment & Plan:   Active Problems:   Acute respiratory failure with hypoxia (HCC)   Acute hypoxic & hypercapnic respiratory failure: weaned of BiPAP this morning. Urine tox is neg. CT brain shows no acute intracranial findings. Continue on supplemental oxygen and wean as tolerated.   Acute metabolic encephalopathy: likely secondary to hypercapnia. Weaned off of BiPAP this morning. Improving.  Re-orient prn   NSTEMI: continue on IV heparin drip. Troponins a mildly elevated. Continue on carvedilol, aspirin, lisinopril, spironolactone. Continue on tele. Cardio consulted  DM2: poorly controlled. Continue to hold home dose of metformin, victoza. Continue on SSI w/ accuchecks   HTN: continue on home dose of coreg, lisinopril,spironolactone   HLD: continue on statin   Transaminitis: etiology unclear. Will continue to monitor  Leukocytosis: likely reactive. Will continue to monitor   Morbid obesity: BMI 58.2. Complicates overall care and prognosis. Would benefit from significant weight loss     DVT prophylaxis: heparin drip  Code Status: full  Family Communication: discussed pt's care w/ pt's family at bedside and answered their questions Disposition Plan: depends on PT/OT recs (not consulted yet)    Status is: Inpatient  Remains inpatient appropriate because:Ongoing diagnostic testing needed not appropriate for outpatient work up, Unsafe d/c plan, IV treatments appropriate due to intensity of illness or inability to take PO and Inpatient level of care appropriate due to severity of illness   Dispo: The patient is from: Home              Anticipated d/c is to: Home              Anticipated d/c date is: 3 days              Patient currently is not medically stable to d/c.    Consultants:   Cardio    Procedures:     Antimicrobials:    Subjective: Pt c/o shortness of breath   Objective: Vitals:   08/03/20 0500 08/03/20 0600 08/03/20 0700 08/03/20 0800  BP: (!) 111/53 (!) 106/51 119/61 116/64  Pulse: 73 75 78 77  Resp: 17 18 16 19   Temp:    98 F (36.7 C)  TempSrc:    Axillary  SpO2: 95% 95% 91% 96%  Weight:      Height:        Intake/Output Summary (Last 24 hours) at 08/03/2020 [id number] Last data filed at 08/02/2020 2332 Gross per 24 hour  Intake --  Output 1100 ml  Net -1100 ml   Filed Weights   08/02/20 1149  Weight: (!) 163.7 kg    Examination:  General exam: Appears calm and comfortable  Respiratory system: diminished breath sounds b/l Cardiovascular system: S1 & S2 + No rubs, gallops or clicks. B/l LE edema Gastrointestinal system: Abdomen is obese soft and nontender. Normal bowel sounds heard. Central nervous system: Alert and oriented. Moves all 4 extremities  Psychiatry: Judgement and insight appear normal. Mood & affect appropriate.     Data Reviewed: I have personally reviewed following labs and imaging studies  CBC: Recent Labs  Lab 08/02/20 1216 08/03/20 0403  WBC 13.6* 13.6*  HGB 15.3 14.5  HCT 51.2 48.3  MCV 88.0 87.5  PLT 343 318   Basic Metabolic Panel: Recent Labs  Lab 08/02/20 1216 08/03/20 0403  NA 134* 138  K 5.3* 4.8  CL 93* 95*  CO2 32 34*  GLUCOSE 232* 108*  BUN 22* 19  CREATININE 1.13 0.94  CALCIUM 8.2* 8.1*   GFR: Estimated Creatinine Clearance: 136.5 mL/min (by C-G formula based on SCr of 0.94 mg/dL). Liver Function Tests: Recent Labs  Lab 08/02/20 1216  AST 108*  ALT 172*  ALKPHOS 91  BILITOT 0.6  PROT 7.7  ALBUMIN 3.2*   No results for input(s): LIPASE, AMYLASE in the last 168 hours. Recent Labs  Lab 08/03/20 0109  AMMONIA 21   Coagulation Profile: Recent Labs  Lab 08/02/20 1215 08/03/20 0403  INR 1.1 1.1   Cardiac Enzymes: No results for input(s): CKTOTAL, CKMB, CKMBINDEX, TROPONINI in the last 168  hours. BNP (last 3 results) No results for input(s): PROBNP in the last 8760 hours. HbA1C: No results for input(s): HGBA1C in the last 72 hours. CBG: Recent Labs  Lab 08/03/20 0103 08/03/20 0357 08/03/20 0802  GLUCAP 121* 105* 100*   Lipid Profile: No results for input(s): CHOL, HDL, LDLCALC, TRIG, CHOLHDL, LDLDIRECT in the last 72 hours. Thyroid Function Tests: No results for input(s): TSH, T4TOTAL, FREET4, T3FREE, THYROIDAB in the last 72 hours. Anemia Panel: No results for input(s): VITAMINB12, FOLATE, FERRITIN, TIBC, IRON, RETICCTPCT in the last 72 hours. Sepsis Labs: No results for input(s): PROCALCITON, LATICACIDVEN in the last 168 hours.  Recent Results (from the past 240 hour(s))  Resp Panel by RT-PCR (Flu A&B, Covid) Nasopharyngeal Swab     Status: None   Collection Time: 08/02/20 12:13 PM   Specimen: Nasopharyngeal Swab; Nasopharyngeal(NP) swabs in vial transport medium  Result Value Ref Range Status   SARS Coronavirus 2 by RT PCR NEGATIVE NEGATIVE Final    Comment: (NOTE) SARS-CoV-2 target nucleic acids are NOT DETECTED.  The SARS-CoV-2 RNA is generally detectable in upper respiratory specimens during the acute phase of infection. The lowest concentration of SARS-CoV-2 viral copies this assay can detect is 138 copies/mL. A negative result does not preclude SARS-Cov-2 infection and should not be used as the sole basis for treatment or other patient management decisions. A negative result may occur with  improper specimen collection/handling, submission of specimen other than nasopharyngeal swab, presence of viral mutation(s) within the areas targeted by this assay, and inadequate number of viral copies(<138 copies/mL). A negative result must be combined with clinical observations, patient history, and epidemiological information. The expected result is Negative.  Fact Sheet for Patients:  BloggerCourse.com  Fact Sheet for Healthcare  Providers:  SeriousBroker.it  This test is no t yet approved or cleared by the [place] FDA and  has been authorized for detection and/or diagnosis of SARS-CoV-2 by FDA under an Emergency Use Authorization (EUA). This EUA will remain  in effect (meaning this test can be used) for the duration of the COVID-19 declaration under Section 564(b)(1) of the Act, 21 U.S.C.section 360bbb-3(b)(1), unless the authorization is terminated  or revoked sooner.       Influenza A by PCR NEGATIVE NEGATIVE Final   Influenza B by PCR NEGATIVE NEGATIVE Final    Comment: (NOTE) The Xpert Xpress SARS-CoV-2/FLU/RSV plus assay is intended as an aid in the diagnosis of influenza from Nasopharyngeal swab specimens and should not be used as a sole basis for treatment. Nasal washings and aspirates are unacceptable for Xpert Xpress SARS-CoV-2/FLU/RSV testing.  Fact Sheet for Patients: BloggerCourse.com  Fact Sheet for Healthcare Providers: SeriousBroker.it  This test is not yet approved or cleared by the [place] FDA and has been authorized for detection and/or diagnosis of SARS-CoV-2  by FDA under an Emergency Use Authorization (EUA). This EUA will remain in effect (meaning this test can be used) for the duration of the COVID-19 declaration under Section 564(b)(1) of the Act, 21 U.S.C. section 360bbb-3(b)(1), unless the authorization is terminated or revoked.  Performed at [facility], [address] [place], [place] [address]          Radiology Studies: CT HEAD WO CONTRAST  Result Date: 08/02/2020 CLINICAL DATA:  Mental status change EXAM: CT HEAD WITHOUT CONTRAST TECHNIQUE: Contiguous axial images were obtained from the base of the skull through the vertex without intravenous contrast. COMPARISON:  None. FINDINGS: Brain: No evidence of acute infarction, hemorrhage, hydrocephalus, extra-axial  collection or mass lesion/mass effect. Vascular: No hyperdense vessel or unexpected calcification. Skull: Normal. Negative for fracture or focal lesion. Sinuses/Orbits: No acute finding. Other: None. IMPRESSION: Negative non contrasted CT appearance of the brain. Electronically Signed   By: Jasmine Pang M.D.   On: 08/02/2020 19:41   CT Angio Chest PE W and/or Wo Contrast  Result Date: 08/02/2020 CLINICAL DATA:  PE suspected, high prob Hypoxia. EXAM: CT ANGIOGRAPHY CHEST WITH CONTRAST TECHNIQUE: Multidetector CT imaging of the chest was performed using the standard protocol during bolus administration of intravenous contrast. Multiplanar CT image reconstructions and MIPs were obtained to evaluate the vascular anatomy. CONTRAST:  76mL OMNIPAQUE IOHEXOL 350 MG/ML SOLN COMPARISON:  Radiograph earlier today. FINDINGS: Cardiovascular: Technically limited exam due to soft tissue attenuate patient from habitus and contrast bolus timing. Evaluation is diagnostic to the proximal segmental levels. There are no filling defects within the pulmonary arteries to suggest pulmonary embolus. The distal segmental and subsegmental branches are not well assessed. Dilatation of the main pulmonary artery at 3.7 cm. Mild aortic atherosclerosis. No aortic aneurysm. No evidence of dissection allowing for phase of contrast tailored to pulmonary artery evaluation. Borderline cardiomegaly. No pericardial effusion. Mediastinum/Nodes: No enlarged mediastinal or hilar lymph nodes. There is mediastinal lipomatosis. Heterogeneous thyroid gland with a punctate calcification in the left lobe, no visualized focal nodule. Lungs/Pleura: Dependent opacities in both lower lungs typical of hypoventilatory atelectasis. There is mild compressive atelectasis in the right middle lobe adjacent to elevated right hemidiaphragm. Slight heterogeneous pulmonary parenchyma. No confluent consolidation. No septal thickening. No pulmonary mass or evident nodule. No  pleural fluid. Upper Abdomen: Enlarged liver with steatosis, partially included. No acute findings in the upper abdomen. Musculoskeletal: Lower thoracic spondylosis. There are no acute or suspicious osseous abnormalities. Review of the MIP images confirms the above findings. IMPRESSION: 1. No central pulmonary embolus. The distal segmental and subsegmental branches are not well assessed. 2. Dilatation of the main pulmonary artery suggesting pulmonary arterial hypertension. 3. Slight heterogeneous pulmonary parenchyma, can be seen with small airways disease. Hypoventilatory atelectasis in both lungs. Aortic Atherosclerosis (ICD10-I70.0). Electronically Signed   By: Narda Rutherford M.D.   On: 08/02/2020 15:47   DG Chest Portable 1 View  Result Date: 08/02/2020 CLINICAL DATA:  Shortness of breath EXAM: PORTABLE CHEST 1 VIEW COMPARISON:  May 27, 2020 FINDINGS: The lungs are clear. Heart is borderline enlarged with pulmonary vascularity normal. No adenopathy. No bone lesions. IMPRESSION: Borderline cardiac enlargement.  Lungs clear. Electronically Signed   By: Bretta Bang III M.D.   On: 08/02/2020 12:48        Scheduled Meds: . aspirin EC  81 mg Oral Daily  . atorvastatin  10 mg Oral Daily  . carvedilol  3.125 mg Oral BID WC  . furosemide  40 mg  Oral BID  . insulin aspart  0-15 Units Subcutaneous Q4H  . lisinopril  20 mg Oral Daily  . spironolactone  25 mg Oral Daily   Continuous Infusions: . heparin 1,850 Units/hr (08/03/20 0811)     LOS: 1 day    Time spent: 38 mins    Charise Killian, MD [facility] Pager [phone]  If 7PM-7AM, please contact night-coverage 08/03/2020, 8:21 AM

## 2020-08-04 DIAGNOSIS — Z7984 Long term (current) use of oral hypoglycemic drugs: Secondary | ICD-10-CM | POA: Diagnosis not present

## 2020-08-04 DIAGNOSIS — J9602 Acute respiratory failure with hypercapnia: Secondary | ICD-10-CM | POA: Diagnosis present

## 2020-08-04 DIAGNOSIS — J9601 Acute respiratory failure with hypoxia: Secondary | ICD-10-CM | POA: Diagnosis present

## 2020-08-04 DIAGNOSIS — E785 Hyperlipidemia, unspecified: Secondary | ICD-10-CM | POA: Diagnosis present

## 2020-08-04 DIAGNOSIS — I248 Other forms of acute ischemic heart disease: Secondary | ICD-10-CM | POA: Diagnosis present

## 2020-08-04 DIAGNOSIS — Z803 Family history of malignant neoplasm of breast: Secondary | ICD-10-CM | POA: Diagnosis not present

## 2020-08-04 DIAGNOSIS — Z79899 Other long term (current) drug therapy: Secondary | ICD-10-CM | POA: Diagnosis not present

## 2020-08-04 DIAGNOSIS — I5033 Acute on chronic diastolic (congestive) heart failure: Secondary | ICD-10-CM | POA: Diagnosis present

## 2020-08-04 DIAGNOSIS — E1165 Type 2 diabetes mellitus with hyperglycemia: Secondary | ICD-10-CM | POA: Diagnosis present

## 2020-08-04 DIAGNOSIS — G4733 Obstructive sleep apnea (adult) (pediatric): Secondary | ICD-10-CM | POA: Diagnosis present

## 2020-08-04 DIAGNOSIS — G9341 Metabolic encephalopathy: Secondary | ICD-10-CM | POA: Diagnosis present

## 2020-08-04 DIAGNOSIS — R7401 Elevation of levels of liver transaminase levels: Secondary | ICD-10-CM | POA: Diagnosis present

## 2020-08-04 DIAGNOSIS — Z880 Allergy status to penicillin: Secondary | ICD-10-CM | POA: Diagnosis not present

## 2020-08-04 DIAGNOSIS — Z87891 Personal history of nicotine dependence: Secondary | ICD-10-CM | POA: Diagnosis not present

## 2020-08-04 DIAGNOSIS — I110 Hypertensive heart disease with heart failure: Secondary | ICD-10-CM | POA: Diagnosis present

## 2020-08-04 DIAGNOSIS — Z20822 Contact with and (suspected) exposure to covid-19: Secondary | ICD-10-CM | POA: Diagnosis present

## 2020-08-04 DIAGNOSIS — Z6841 Body Mass Index (BMI) 40.0 and over, adult: Secondary | ICD-10-CM | POA: Diagnosis not present

## 2020-08-04 LAB — COMPREHENSIVE METABOLIC PANEL
ALT: 106 U/L — ABNORMAL HIGH (ref 0–44)
AST: 55 U/L — ABNORMAL HIGH (ref 15–41)
Albumin: 2.6 g/dL — ABNORMAL LOW (ref 3.5–5.0)
Alkaline Phosphatase: 69 U/L (ref 38–126)
Anion gap: 10 (ref 5–15)
BUN: 18 mg/dL (ref 6–20)
CO2: 35 mmol/L — ABNORMAL HIGH (ref 22–32)
Calcium: 8.3 mg/dL — ABNORMAL LOW (ref 8.9–10.3)
Chloride: 92 mmol/L — ABNORMAL LOW (ref 98–111)
Creatinine, Ser: 0.82 mg/dL (ref 0.61–1.24)
GFR, Estimated: >60 mL/min (ref >60)
Glucose, Bld: 86 mg/dL (ref 70–99)
Potassium: 4.2 mmol/L (ref 3.5–5.1)
Sodium: 137 mmol/L (ref 135–145)
Total Bilirubin: 0.8 mg/dL (ref 0.3–1.2)
Total Protein: 6.4 g/dL — ABNORMAL LOW (ref 6.5–8.1)

## 2020-08-04 LAB — BLOOD GAS, ARTERIAL
Acid-Base Excess: 10.7 mmol/L — ABNORMAL HIGH (ref 0.0–2.0)
Bicarbonate: 43 mmol/L — ABNORMAL HIGH (ref 20.0–28.0)
Delivery systems: POSITIVE
Expiratory PAP: 8
FIO2: 0.5
Inspiratory PAP: 12
O2 Saturation: 41.4 %
Patient temperature: 37
RATE: 12 resp/min
pCO2 arterial: 98 mmHg (ref 32.0–48.0)
pH, Arterial: 7.25 — ABNORMAL LOW (ref 7.350–7.450)

## 2020-08-04 LAB — CBC
HCT: 45.8 % (ref 39.0–52.0)
Hemoglobin: 13.9 g/dL (ref 13.0–17.0)
MCH: 26.5 pg (ref 26.0–34.0)
MCHC: 30.3 g/dL (ref 30.0–36.0)
MCV: 87.2 fL (ref 80.0–100.0)
Platelets: 308 10*3/uL (ref 150–400)
RBC: 5.25 MIL/uL (ref 4.22–5.81)
RDW: 15.3 % (ref 11.5–15.5)
WBC: 11.7 10*3/uL — ABNORMAL HIGH (ref 4.0–10.5)
nRBC: 0 % (ref 0.0–0.2)

## 2020-08-04 LAB — GLUCOSE, CAPILLARY
Glucose-Capillary: 104 mg/dL — ABNORMAL HIGH (ref 70–99)
Glucose-Capillary: 103 mg/dL — ABNORMAL HIGH (ref 70–99)
Glucose-Capillary: 150 mg/dL — ABNORMAL HIGH (ref 70–99)
Glucose-Capillary: 90 mg/dL (ref 70–99)
Glucose-Capillary: 104 mg/dL — ABNORMAL HIGH (ref 70–99)
Glucose-Capillary: 167 mg/dL — ABNORMAL HIGH (ref 70–99)

## 2020-08-04 LAB — HEPARIN LEVEL (UNFRACTIONATED): Heparin Unfractionated: 0.1 IU/mL — ABNORMAL LOW (ref 0.30–0.70)

## 2020-08-04 MED ORDER — INSULIN ASPART 100 UNIT/ML ~~LOC~~ SOLN
0.0000 [IU] | Freq: Three times a day (TID) | SUBCUTANEOUS | Status: DC
  Administered 2020-08-05: 13:00:00 2 [IU] via SUBCUTANEOUS
  Filled 2020-08-04: qty 1

## 2020-08-04 MED ORDER — ENOXAPARIN SODIUM 100 MG/ML ~~LOC~~ SOLN
0.5000 mg/kg | SUBCUTANEOUS | Status: DC
  Administered 2020-08-04 – 2020-08-05 (×2): 82.5 mg via SUBCUTANEOUS
  Filled 2020-08-04 (×2): qty 1

## 2020-08-04 MED ORDER — FUROSEMIDE 10 MG/ML IJ SOLN
40.0000 mg | Freq: Two times a day (BID) | INTRAMUSCULAR | Status: DC
  Administered 2020-08-04 – 2020-08-05 (×3): 40 mg via INTRAVENOUS
  Filled 2020-08-04 (×3): qty 4

## 2020-08-04 MED ORDER — ENOXAPARIN SODIUM 40 MG/0.4ML ~~LOC~~ SOLN: 40.0000 mg | SUBCUTANEOUS | Status: DC

## 2020-08-04 NOTE — Plan of Care (Signed)
Pt in bed sleeping. Pt transferred to unit from ICU. Pt states he is not in any pain and does not need anything upon arrival. Pt is alert and oriented x 4. Will continue pt care.  Problem: Education: Goal: Knowledge of General Education information will improve Description: Including pain rating scale, medication(s)/side effects and non-pharmacologic comfort measures Outcome: Progressing   Problem: Health Behavior/Discharge Planning: Goal: Ability to manage health-related needs will improve Outcome: Progressing   Problem: Clinical Measurements: Goal: Ability to maintain clinical measurements within normal limits will improve Outcome: Progressing Goal: Will remain free from infection Outcome: Progressing Goal: Diagnostic test results will improve Outcome: Progressing Goal: Respiratory complications will improve Outcome: Progressing Goal: Cardiovascular complication will be avoided Outcome: Progressing   Problem: Activity: Goal: Risk for activity intolerance will decrease Outcome: Progressing   Problem: Nutrition: Goal: Adequate nutrition will be maintained Outcome: Progressing   Problem: Coping: Goal: Level of anxiety will decrease Outcome: Progressing   Problem: Elimination: Goal: Will not experience complications related to bowel motility Outcome: Progressing Goal: Will not experience complications related to urinary retention Outcome: Progressing   Problem: Pain Managment: Goal: General experience of comfort will improve Outcome: Progressing   Problem: Safety: Goal: Ability to remain free from injury will improve Outcome: Progressing   Problem: Skin Integrity: Goal: Risk for impaired skin integrity will decrease Outcome: Progressing

## 2020-08-04 NOTE — Progress Notes (Signed)
Pt received to room 240 from ICU at this time.  Pt oriented to room and call bell.  See assessment and vs's.  NSR 70's.

## 2020-08-04 NOTE — Progress Notes (Signed)
PHARMACIST - PHYSICIAN COMMUNICATION  CONCERNING:  Enoxaparin (Lovenox) for DVT Prophylaxis    RECOMMENDATION: Patient was prescribed enoxaparin 40mg  q24 hours for VTE prophylaxis.   Filed Weights   08/02/20 1149 08/04/20 0016 08/04/20 0435  Weight: (!) 163.7 kg (361 lb) (!) 167.3 kg (368 lb 14.4 oz) (!) 167.3 kg (368 lb 14.4 oz)    Body mass index is 59.54 kg/m.  Estimated Creatinine Clearance: 158.6 mL/min (by C-G formula based on SCr of 0.82 mg/dL).   Based on [facility] policy patient is candidate for enoxaparin 0.5mg /kg TBW SQ every 24 hours based on BMI being >30.  DESCRIPTION: Pharmacy has adjusted enoxaparin dose per [facility] policy.  Patient is now receiving enoxaparin 82.5 mg every 24 hours    [facility] 08/04/2020 7:52 AM

## 2020-08-04 NOTE — Evaluation (Signed)
Occupational Therapy Evaluation Patient Details Name: Tuff Clabo MRN: [id number] DOB: [birth year] Today's Date: 08/04/2020    History of Present Illness Haley Roza is a 49 y.o. male with a known history of DM, HTN, who presents to the emergency department with complaints of shortness of breath, fatigue, and chest pain. On arrival to ED O2 sats were found to be 30% but rebounded to 80's on 5L. He was found to be hypercapneic and was placed on bipap for acute hypoxic respiratory failure. Pt admitted for further management.   Clinical Impression   Mr. Susann Givens was seen for OT evaluation this date. Hospital in-person interpreter services utilized to facilitate Spanish language translation throughout full evaluation. Pt was independent in all ADL and functional mobility, living with his spouse and 4 children in a house with 2 STE and R handrail. Pt denies use of supplemental O2 in the home. He reports becoming easily fatigued or out of breath with minimal exertion over last several days. Pt currently requires supervision for safety during exertional ADL management due to current functional impairments (See OT Problem List below). Pt educated in energy conservation strategies including pursed lip breathing, activity pacing, routines modifications, positioning strategies to maximize respiratory function, and falls prevention strategies. Pt verbalized understanding and would benefit from additional skilled OT services to maximize recall and carryover of learned techniques and facilitate implementation of learned techniques into daily routines. Upon discharge, recommend HHOT services.       Follow Up Recommendations  Home health OT;Supervision - Intermittent    Equipment Recommendations  3 in 1 bedside commode [facility])    Recommendations for Other Services       Precautions / Restrictions Precautions Precautions: Fall Precaution Comments: Moderate  Fall Restrictions Weight Bearing Restrictions: No Other Position/Activity Restrictions: Monitor HR; O2      Mobility Bed Mobility Overal bed mobility: Needs Assistance Bed Mobility: Supine to Sit;Sit to Supine     Supine to sit: Modified independent (Device/Increase time);HOB elevated Sit to supine: Modified independent (Device/Increase time);HOB elevated   General bed mobility comments: Increased time/effort to perform.    Transfers Overall transfer level: Needs assistance Equipment used: 1 person hand held assist Transfers: Sit to/from Stand Sit to Stand: Supervision         General transfer comment: STS from EOB with increased time/effort to perform. VCs for hand/foot placement for safety.    Balance Overall balance assessment: Needs assistance Sitting-balance support: Feet supported;No upper extremity supported Sitting balance-Leahy Scale: Good Sitting balance - Comments: Steady static sitting, reaching within BOS.   Standing balance support: During functional activity;Single extremity supported;No upper extremity supported Standing balance-Leahy Scale: Good Standing balance comment: No overt LOB appreciated during session, pt able to side-step at EOB w/o UE support.                           ADL either performed or assessed with clinical judgement   ADL Overall ADL's : Needs assistance/impaired                                       General ADL Comments: Pt functionally limited by cardiopulmonary status. He requires increased time/effort to perform bed/functional mobility with supervision for safety and cueing for implementation of energy conservation strategies during evaluation. Anticipate supervision to MIN A for more exertional ADL tasks including LB dressing and  bathing.     Vision Baseline Vision/History: Wears glasses Wears Glasses: Reading only Patient Visual Report: No change from baseline       Perception     Praxis       Pertinent Vitals/Pain Pain Assessment: Faces Faces Pain Scale: Hurts little more Pain Location: Lower back Pain Descriptors / Indicators: Aching;Sore Pain Intervention(s): Limited activity within patient's tolerance;Repositioned;Monitored during session     Hand Dominance Right   Extremity/Trunk Assessment Upper Extremity Assessment Upper Extremity Assessment: Overall WFL for tasks assessed (BUE grossly 4 to 5/5 T/o No focal weakness appreciated. Full AROM WFL. Grip/FMC WNL.)   Lower Extremity Assessment Lower Extremity Assessment: Overall WFL for tasks assessed   Cervical / Trunk Assessment Cervical / Trunk Assessment: Normal   Communication Communication Communication: Prefers language other than [place];Interpreter utilized (Research officer, trade union)   Cognition Arousal/Alertness: Administrator, Civil Service;Awake/alert (Intermittently lethargic, closing eyes t/o session. RN notified.) Behavior During Therapy: WFL for tasks assessed/performed Overall Cognitive Status: Within Functional Limits for tasks assessed                                 General Comments: Pt generally alert and oriented, able to follow VCs t/o session. Occasionally requires repetition to answer PLOF questions. He is noted to be intermittently lethargic/close his eyes t/o session. RN notified/aware.   General Comments  Pt vitals monitored during session. He remains on 4L [place] with SpO2 WFL t/o. Pt recieved semi-supine with spO2 of 92% HR in 60's-70's. SPO2 improves to 95% once pt seated at EOB, engaging in PLB technique and remains between 95-97% with functional activity.    Exercises Other Exercises Other Exercises: Pt educated on role of OT in acute setting, energy conservation strategies including pursed lip breathing and activity pacing, considerations for body positioning to support respiratory status including maintaining HOB at or above 30 while resting or sleeping, and routines modifications to support safety  and functional independence upon hospital DC.   Shoulder Instructions      Home Living Family/patient expects to be discharged to:: Private residence Living Arrangements: Spouse/significant other;Children (4 children; oldest is 66.) Available Help at Discharge: Family;Available 24 hours/day (Wife stays home.) Type of Home: House Home Access: Stairs to enter [employer] of Steps: 2 Entrance Stairs-Rails: Right       Bathroom Shower/Tub: Producer, television/film/video: Standard     Home Equipment: Cane - single point          Prior Functioning/Environment Level of Independence: Independent        Comments: Pt reports he was totally independent with all ADL/IADL management prior to admission. He denies falls history or home O2 use. +driving.        OT Problem List: Cardiopulmonary status limiting activity;Decreased activity tolerance;Decreased safety awareness;Decreased knowledge of use of DME or AE;Obesity      OT Treatment/Interventions: Self-care/ADL training;Therapeutic exercise;Therapeutic activities;DME and/or AE instruction;Patient/family education;Energy conservation    OT Goals(Current goals can be found in the care plan section) Acute Rehab OT Goals Patient Stated Goal: To go home OT Goal Formulation: With patient Time For Goal Achievement: 08/18/20 Potential to Achieve Goals: Good ADL Goals Pt Will Perform Grooming: sitting;with set-up (c LRAD PRN for improved safety and functional indep.) Pt Will Transfer to Toilet: bedside commode;with modified independence (c LRAD PRN for improved safety and functional indep.) Pt Will Perform Toileting - Clothing Manipulation and hygiene: sit to/from stand;with modified independence (c LRAD  PRN for improved safety and functional indep.) Additional ADL Goal #1: Pt will independently verbalize a plan to implement at least 3 learned energy conservation strategies into his daily routines/home environment for  improved safety and functional independence upon hospital DC.  OT Frequency: Min 1X/week   Barriers to D/C:            Co-evaluation              AM-PAC OT "6 Clicks" Daily Activity     Outcome Measure Help from another person eating meals?: None Help from another person taking care of personal grooming?: A Little Help from another person toileting, which includes using toliet, bedpan, or urinal?: A Little Help from another person bathing (including washing, rinsing, drying)?: A Little Help from another person to put on and taking off regular upper body clothing?: A Little Help from another person to put on and taking off regular lower body clothing?: A Little 6 Click Score: 19   End of Session Equipment Utilized During Treatment: Gait belt Nurse Communication: Mobility status;Other (comment) (Pt lethargic)  Activity Tolerance: Patient tolerated treatment well Patient left: in bed;with call bell/phone within reach;with bed alarm set  OT Visit Diagnosis: Other abnormalities of gait and mobility (R26.89)                Time: 1610-9604 OT Time Calculation (min): 23 min Charges:  OT General Charges $OT Visit: 1 Visit OT Evaluation $OT Eval Moderate Complexity: 1 Mod OT Treatments $Self Care/Home Management : 8-22 mins  Rockney Ghee, M.S., OTR/L Ascom: [phone] 08/04/20, 3:40 PM

## 2020-08-04 NOTE — Progress Notes (Signed)
Patient Name: Benjamin Moran Benjamin Moran Date of Encounter: 08/04/2020  Hospital Problem List     Active Problems:   Acute respiratory failure with hypoxia [facility])    Patient Profile     49 year old Hispanic male presents with severe dyspnea shortness of breath and encephalopathy with severe hypoxemia patient recently diagnosed with sepsis sleep apnea but has not received his machine reportedly became more hypoxic confused sats were as low as 30% eventually was brought in by rescue placed on BiPAP which seemed to improve his symptoms work-up for other indications for altered mental status were entertained no clear evidence of sepsis no fever chills or sweats patient was placed in intensive care unit for treatment of diabetes hypertension and respiratory status patient had mildly borderline elevation of troponins but no cardiac type symptoms except for heart failure  Subjective   Still sob  Inpatient Medications    . aspirin EC  81 mg Oral Daily  . atorvastatin  10 mg Oral Daily  . carvedilol  3.125 mg Oral BID WC  . chlorhexidine  15 mL Mouth Rinse BID  . Chlorhexidine Gluconate Cloth  6 each Topical Daily  . enoxaparin (LOVENOX) injection  0.5 mg/kg Subcutaneous Q24H  . furosemide  40 mg Intravenous BID  . insulin aspart  0-15 Units Subcutaneous Q4H  . lisinopril  20 mg Oral Daily  . mouth rinse  15 mL Mouth Rinse q12n4p  . pneumococcal 23 valent vaccine  0.5 mL Intramuscular Tomorrow-1000  . spironolactone  25 mg Oral Daily    Vital Signs    Vitals:   08/04/20 0435 08/04/20 0446 08/04/20 0719 08/04/20 1123  BP: (!) 123/56 119/61 120/61 130/81  Pulse: (!) 55 64 66 67  Resp: Temp: 97.7 F (36.5 C) (!) 97.4 F (36.3 C) 97.7 F (36.5 C) 98.1 F (36.7 C)  TempSrc: Oral Oral Axillary Oral  SpO2: 97% 94% 96% 94%  Weight: (!) 167.3 kg     Height:        Intake/Output Summary (Last 24 hours) at 08/04/2020 1525 Last data filed at 08/04/2020 1423 Gross per  24 hour  Intake 673.73 ml  Output 3200 ml  Net -2526.27 ml   Filed Weights   08/02/20 1149 08/04/20 0016 08/04/20 0435  Weight: (!) 163.7 kg (!) 167.3 kg (!) 167.3 kg    Physical Exam    GEN: Well nourished, well developed, in no acute distress.  HEENT: normal.  Neck: Supple, no JVD, carotid bruits, or masses. Cardiac: RRR, no murmurs, rubs, or gallops. No clubbing, cyanosis, edema.  Radials/DP/PT 2+ and equal bilaterally.  Respiratory:  Respirations regular and unlabored, clear to auscultation bilaterally. GI: obese MS: no deformity or atrophy. Skin: warm and dry, no rash. Neuro:  Strength and sensation are intact. Psych: Normal affect.  Labs    CBC Recent Labs    08/03/20 0403 08/04/20 0427  WBC 13.6* 11.7*  HGB 14.5 13.9  HCT 48.3 45.8  MCV 87.5 87.2  PLT 318 308   Basic Metabolic Panel Recent Labs    40/98/11 0403 08/04/20 0427  NA 138 137  K 4.8 4.2  CL 95* 92*  CO2 34* 35*  GLUCOSE 108* 86  BUN 19 18  CREATININE 0.94 0.82  CALCIUM 8.1* 8.3*   Liver Function Tests Recent Labs    08/02/20 1216 08/04/20 0427  AST 108* 55*  ALT 172* 106*  ALKPHOS 91 69  BILITOT 0.6 0.8  PROT 7.7 6.4*  ALBUMIN 3.2* 2.6*   No results for input(s): LIPASE, AMYLASE in the last 72 hours. Cardiac Enzymes No results for input(s): CKTOTAL, CKMB, CKMBINDEX, TROPONINI in the last 72 hours. BNP Recent Labs    08/02/20 1216  BNP 263.0*   D-Dimer No results for input(s): DDIMER in the last 72 hours. Hemoglobin A1C Recent Labs    08/03/20 0024  HGBA1C 8.0*   Fasting Lipid Panel No results for input(s): CHOL, HDL, LDLCALC, TRIG, CHOLHDL, LDLDIRECT in the last 72 hours. Thyroid Function Tests No results for input(s): TSH, T4TOTAL, T3FREE, THYROIDAB in the last 72 hours.  Invalid input(s): FREET3  Telemetry   nsr  ECG    nsr with no ischemia  Radiology    CT HEAD WO CONTRAST  Result Date: 08/02/2020 CLINICAL DATA:  Mental status change EXAM: CT HEAD  WITHOUT CONTRAST TECHNIQUE: Contiguous axial images were obtained from the base of the skull through the vertex without intravenous contrast. COMPARISON:  None. FINDINGS: Brain: No evidence of acute infarction, hemorrhage, hydrocephalus, extra-axial collection or mass lesion/mass effect. Vascular: No hyperdense vessel or unexpected calcification. Skull: Normal. Negative for fracture or focal lesion. Sinuses/Orbits: No acute finding. Other: None. IMPRESSION: Negative non contrasted CT appearance of the brain. Electronically Signed   By: Jasmine Pang M.D.   On: 08/02/2020 19:41   CT Angio Chest PE W and/or Wo Contrast  Result Date: 08/02/2020 CLINICAL DATA:  PE suspected, high prob Hypoxia. EXAM: CT ANGIOGRAPHY CHEST WITH CONTRAST TECHNIQUE: Multidetector CT imaging of the chest was performed using the standard protocol during bolus administration of intravenous contrast. Multiplanar CT image reconstructions and MIPs were obtained to evaluate the vascular anatomy. CONTRAST:  47mL OMNIPAQUE IOHEXOL 350 MG/ML SOLN COMPARISON:  Radiograph earlier today. FINDINGS: Cardiovascular: Technically limited exam due to soft tissue attenuate patient from habitus and contrast bolus timing. Evaluation is diagnostic to the proximal segmental levels. There are no filling defects within the pulmonary arteries to suggest pulmonary embolus. The distal segmental and subsegmental branches are not well assessed. Dilatation of the main pulmonary artery at 3.7 cm. Mild aortic atherosclerosis. No aortic aneurysm. No evidence of dissection allowing for phase of contrast tailored to pulmonary artery evaluation. Borderline cardiomegaly. No pericardial effusion. Mediastinum/Nodes: No enlarged mediastinal or hilar lymph nodes. There is mediastinal lipomatosis. Heterogeneous thyroid gland with a punctate calcification in the left lobe, no visualized focal nodule. Lungs/Pleura: Dependent opacities in both lower lungs typical of hypoventilatory  atelectasis. There is mild compressive atelectasis in the right middle lobe adjacent to elevated right hemidiaphragm. Slight heterogeneous pulmonary parenchyma. No confluent consolidation. No septal thickening. No pulmonary mass or evident nodule. No pleural fluid. Upper Abdomen: Enlarged liver with steatosis, partially included. No acute findings in the upper abdomen. Musculoskeletal: Lower thoracic spondylosis. There are no acute or suspicious osseous abnormalities. Review of the MIP images confirms the above findings. IMPRESSION: 1. No central pulmonary embolus. The distal segmental and subsegmental branches are not well assessed. 2. Dilatation of the main pulmonary artery suggesting pulmonary arterial hypertension. 3. Slight heterogeneous pulmonary parenchyma, can be seen with small airways disease. Hypoventilatory atelectasis in both lungs. Aortic Atherosclerosis (ICD10-I70.0). Electronically Signed   By: Narda Rutherford M.D.   On: 08/02/2020 15:47   DG Chest Portable 1 View  Result Date: 08/02/2020 CLINICAL DATA:  Shortness of breath EXAM: PORTABLE CHEST 1 VIEW COMPARISON:  May 27, 2020 FINDINGS: The lungs are clear. Heart is borderline enlarged with pulmonary vascularity normal. No adenopathy. No bone lesions. IMPRESSION: Borderline cardiac enlargement.  Lungs clear. Electronically Signed   By: Bretta Bang III M.D.   On: 08/02/2020 12:48   ECHOCARDIOGRAM COMPLETE  Result Date: 08/03/2020    ECHOCARDIOGRAM REPORT   Patient Name:   Benjamin Moran Date of Exam: 08/03/2020 Medical Rec #:  [id number]                   Height:       66.0 in Accession #:    [id number]                  Weight:       361.0 lb Date of Birth:  Apr 04, 1971                    BSA:          2.570 m Patient Age:    49 years                    BP:           144/70 mmHg Patient Gender: M                           HR:           73 bpm. Exam Location:  [facility] Procedure: 2D Echo, Color Doppler, Cardiac Doppler and  Intracardiac            Opacification Agent Indications:     I21.4 NSTEMI  History:         Patient has prior history of Echocardiogram examinations, most                  recent 07/17/2020. Risk Factors:Hypertension and Diabetes.  Sonographer:     Humphrey Rolls RDCS (AE) Referring Phys:  [id number] Tonye Royalty Diagnosing Phys: Julien Nordmann MD  Sonographer Comments: Technically difficult study due to poor echo windows. Image acquisition challenging due to patient body habitus. IMPRESSIONS  1. Left ventricular ejection fraction, by estimation, is 60 to 65%. The left ventricle has normal function. The left ventricle has no regional wall motion abnormalities. Left ventricular diastolic parameters are consistent with Grade I diastolic dysfunction (impaired relaxation).  2. Right ventricular systolic function is normal. The right ventricular size is normal. FINDINGS  Left Ventricle: Left ventricular ejection fraction, by estimation, is 60 to 65%. The left ventricle has normal function. The left ventricle has no regional wall motion abnormalities. Definity contrast agent was given IV to delineate the left ventricular  endocardial borders. The left ventricular internal cavity size was normal in size. There is no left ventricular hypertrophy. Left ventricular diastolic parameters are consistent with Grade I diastolic dysfunction (impaired relaxation). Right Ventricle: The right ventricular size is normal. No increase in right ventricular wall thickness. Right ventricular systolic function is normal. Left Atrium: Left atrial size was normal in size. Right Atrium: Right atrial size was normal in size. Pericardium: There is no evidence of pericardial effusion. Mitral Valve: The mitral valve is normal in structure. No evidence of mitral valve regurgitation. No evidence of mitral valve stenosis. MV peak gradient, 4.8 mmHg. The mean mitral valve gradient is 2.0 mmHg. Tricuspid Valve: The tricuspid valve is normal in structure.  Tricuspid valve regurgitation is not demonstrated. No evidence of tricuspid stenosis. Aortic Valve: The aortic valve is normal in structure. Aortic valve regurgitation is not visualized. No aortic stenosis is present. Aortic valve mean gradient measures 4.0 mmHg. Aortic valve peak gradient  measures 9.2 mmHg. Aortic valve area, by VTI measures 3.99 cm. Pulmonic Valve: The pulmonic valve was normal in structure. Pulmonic valve regurgitation is not visualized. No evidence of pulmonic stenosis. Aorta: The aortic root is normal in size and structure. Venous: The inferior vena cava is normal in size with greater than 50% respiratory variability, suggesting right atrial pressure of 3 mmHg. IAS/Shunts: No atrial level shunt detected by color flow Doppler.  LEFT VENTRICLE PLAX 2D LVIDd:         4.82 cm      Diastology LVIDs:         3.04 cm      LV e' medial:    8.59 cm/s LV PW:         0.91 cm      LV E/e' medial:  10.3 LV IVS:        0.91 cm      LV e' lateral:   9.25 cm/s LVOT diam:     2.20 cm      LV E/e' lateral: 9.5 LV SV:         112 LV SV Index:   43 LVOT Area:     3.80 cm  LV Volumes (MOD) LV vol d, MOD A2C: 128.0 ml LV vol d, MOD A4C: 136.0 ml LV vol s, MOD A2C: 43.0 ml LV vol s, MOD A4C: 46.4 ml LV SV MOD A2C:     85.0 ml LV SV MOD A4C:     136.0 ml LV SV MOD BP:      89.5 ml LEFT ATRIUM           Index LA Vol (A4C): 27.5 ml 10.70 ml/m  AORTIC VALVE                   PULMONIC VALVE AV Area (Vmax):    3.75 cm    PV Vmax:       1.05 m/s AV Area (Vmean):   3.69 cm    PV Vmean:      69.500 cm/s AV Area (VTI):     3.99 cm    PV VTI:        0.210 m AV Vmax:           152.00 cm/s PV Peak grad:  4.4 mmHg AV Vmean:          97.300 cm/s PV Mean grad:  2.0 mmHg AV VTI:            0.280 m AV Peak Grad:      9.2 mmHg AV Mean Grad:      4.0 mmHg LVOT Vmax:         150.00 cm/s LVOT Vmean:        94.400 cm/s LVOT VTI:          0.294 m LVOT/AV VTI ratio: 1.05  AORTA Ao Root diam: 2.80 cm MITRAL VALVE MV Area (PHT): 3.21 cm     SHUNTS MV Peak grad:  4.8 mmHg    Systemic VTI:  0.29 m MV Mean grad:  2.0 mmHg    Systemic Diam: 2.20 cm MV Vmax:       1.10 m/s MV Vmean:      69.3 cm/s MV Decel Time: 236 msec MV E velocity: 88.30 cm/s MV A velocity: 84.40 cm/s MV E/A ratio:  1.05 Julien Nordmann MD Electronically signed by Julien Nordmann MD Signature Date/Time: 08/03/2020/4:37:06 PM    Final    ECHOCARDIOGRAM COMPLETE  Result Date: 07/17/2020  ECHOCARDIOGRAM REPORT   Patient Name:   Benjamin Moran Date of Exam: 07/17/2020 Medical Rec #:  [id number]                   Height:       66.0 in Accession #:    [id number]                  Weight:       358.0 lb Date of Birth:  10/15/1970                    BSA:          2.561 m Patient Age:    49 years                    BP:           135/74 mmHg Patient Gender: M                           HR:           97 bpm. Exam Location:  [facility] Procedure: 2D Echo, Cardiac Doppler and Color Doppler Indications:     Congestive Heart Failure 428.0  History:         Patient has no prior history of Echocardiogram examinations.                  Risk Factors:Hypertension and Diabetes.  Sonographer:     Cristela BlueJerry Hege RDCS (AE) Referring Phys:  [id number] Delma FreezeINA A HACKNEY Diagnosing Phys: Arnoldo HookerBruce Kowalski MD  Sonographer Comments: No apical window, no subcostal window and Technically challenging study due to limited acoustic windows. IMPRESSIONS  1. Left ventricular ejection fraction, by estimation, is 55 to 60%. The left ventricle has normal function. The left ventricle has no regional wall motion abnormalities. Left ventricular diastolic parameters were normal.  2. Right ventricular systolic function is normal. The right ventricular size is normal.  3. The mitral valve is normal in structure. Trivial mitral valve regurgitation.  4. The aortic valve is normal in structure. Aortic valve regurgitation is not visualized. FINDINGS  Left Ventricle: Left ventricular ejection fraction, by estimation, is 55 to 60%. The  left ventricle has normal function. The left ventricle has no regional wall motion abnormalities. The left ventricular internal cavity size was normal in size. There is  no left ventricular hypertrophy. Left ventricular diastolic parameters were normal. Right Ventricle: The right ventricular size is normal. No increase in right ventricular wall thickness. Right ventricular systolic function is normal. Left Atrium: Left atrial size was normal in size. Right Atrium: Right atrial size was normal in size. Pericardium: There is no evidence of pericardial effusion. Mitral Valve: The mitral valve is normal in structure. Trivial mitral valve regurgitation. Tricuspid Valve: The tricuspid valve is normal in structure. Tricuspid valve regurgitation is trivial. Aortic Valve: The aortic valve is normal in structure. Aortic valve regurgitation is not visualized. Pulmonic Valve: The pulmonic valve was normal in structure. Pulmonic valve regurgitation is not visualized. Aorta: The aortic root and ascending aorta are structurally normal, with no evidence of dilitation. IAS/Shunts: No atrial level shunt detected by color flow Doppler.  LEFT VENTRICLE PLAX 2D LVIDd:         4.67 cm LVIDs:         3.23 cm LV PW:         1.25 cm LV IVS:  1.49 cm LVOT diam:     2.00 cm LVOT Area:     3.14 cm  LEFT ATRIUM         Index LA diam:    4.70 cm 1.84 cm/m                        PULMONIC VALVE AORTA                 PV Vmax:        0.74 m/s Ao Root diam: 3.00 cm PV Peak grad:   2.2 mmHg                       RVOT Peak grad: 3 mmHg   SHUNTS Systemic Diam: 2.00 cm Arnoldo Hooker MD Electronically signed by Arnoldo Hooker MD Signature Date/Time: 07/17/2020/12:07:39 PM    Final     Assessment & Plan     ASSESSMENT AND PLAN:  Acute respiratory failure Hypoxemia Morbid obesity Obstructive sleep apnea Diabetes Congestive heart failure diastolic dysfunction Hypertension Elevated troponin/demand ischemia Hyperlipidemia Shortness of  breath . Plan Transferred to telemetry. Stable hemodynamically. Less sob  Consider broad-spectrum antibiotic therapy for possible orchitis Diuretic therapy for heart failure BiPAP or CPAP for respiratory failure Continue diabetes management and control with insulin Short-term anticoagulation therapy mainly for DVT prophylaxis This does not appear to represent an acute coronary syndrome and troponins are probably demand ischemia Encephalopathy probably related to hypoxemia  Signed, Darlin Priestly. Hubert Raatz MD 08/04/2020, 3:25 PM  Pager: [phone]

## 2020-08-04 NOTE — Progress Notes (Signed)
PROGRESS NOTE    Benjamin Moran  YQI:[id number] DOB: 1970-12-13 DOA: 08/02/2020 PCP: [facility]    Assessment & Plan:   Active Problems:   Acute respiratory failure with hypoxia (HCC)   Acute hypoxic & hypercapnic respiratory failure: initially required BiPAP but it has since been weaned off. Urine tox is neg. CT brain shows no acute intracranial findings. Continue on supplemental oxygen and wean as tolerated. Needs a sleep study as an outpatient   Acute metabolic encephalopathy: likely secondary to hypercapnia. Resolved  Elevated troponins: likely secondary to demand ischemia. NSTEMI r/o. D/c IV heparin drip. Continue on carvedilol, aspirin, lisinopril, spironolactone. Continue on tele. Cardio following   DM2: poorly controlled. Continue to hold home dose of metformin, victoza. Continue on SSI w/ accuchecks.   HTN: continue on home dose of coreg, lisinopril, & spironolactone  HLD: continue on statin   Transaminitis: etiology unclear, trending down. Will continue to monitor   Leukocytosis: likely reactive, trending down. Will continue to monitor   Morbid obesity: BMI 58.2. Complicates overall care and prognosis. Would benefit from significant weight loss     DVT prophylaxis: lovenox Code Status: full  Family Communication:  Disposition Plan: depends on PT/OT recs (not consulted yet)    Status is: Inpatient  Remains inpatient appropriate because:Ongoing diagnostic testing needed not appropriate for outpatient work up, Unsafe d/c plan, IV treatments appropriate due to intensity of illness or inability to take PO and Inpatient level of care appropriate due to severity of illness   Dispo: The patient is from: Home              Anticipated d/c is to: Home              Anticipated d/c date is: 3 days              Patient currently is not medically stable to d/c.    Consultants:   Cardio    Procedures:    Antimicrobials:     Subjective: Pt c/o fatigue   Objective: Vitals:   08/04/20 0043 08/04/20 0435 08/04/20 0446 08/04/20 0719  BP:  (!) 123/56 119/61 120/61  Pulse: 61 (!) 55 64 66  Resp:  Temp:  97.7 F (36.5 C) (!) 97.4 F (36.3 C) 97.7 F (36.5 C)  TempSrc:  Oral Oral Axillary  SpO2: 97% 97% 94% 96%  Weight:  (!) 167.3 kg    Height:        Intake/Output Summary (Last 24 hours) at 08/04/2020 0724 Last data filed at 08/04/2020 0454 Gross per 24 hour  Intake 413.45 ml  Output 2000 ml  Net -1586.55 ml   Filed Weights   08/02/20 1149 08/04/20 0016 08/04/20 0435  Weight: (!) 163.7 kg (!) 167.3 kg (!) 167.3 kg    Examination:  General exam: Appears calm & comfortable. Morbidly obese Respiratory system: decreased breath sounds b/l  Cardiovascular system: S1/S2+. No clicks or rubs Gastrointestinal system: Abdomen is obese soft and nontender. Normal bowel sounds heard. Central nervous system: Alert and oriented. Moves all 4 extremities  Psychiatry: Judgement and insight appear normal. Mood & affect appropriate.     Data Reviewed: I have personally reviewed following labs and imaging studies  CBC: Recent Labs  Lab 08/02/20 1216 08/03/20 0403 08/04/20 0427  WBC 13.6* 13.6* 11.7*  HGB 15.3 14.5 13.9  HCT 51.2 48.3 45.8  MCV 88.0 87.5 87.2  PLT 343 318 308   Basic Metabolic Panel: Recent  Labs  Lab 08/02/20 1216 08/03/20 0403 08/04/20 0427  NA 134* 138 137  K 5.3* 4.8 4.2  CL 93* 95* 92*  CO2 32 34* 35*  GLUCOSE 232* 108* 86  BUN 22* 19 18  CREATININE 1.13 0.94 0.82  CALCIUM 8.2* 8.1* 8.3*   GFR: Estimated Creatinine Clearance: 158.6 mL/min (by C-G formula based on SCr of 0.82 mg/dL). Liver Function Tests: Recent Labs  Lab 08/02/20 1216 08/04/20 0427  AST 108* 55*  ALT 172* 106*  ALKPHOS 91 69  BILITOT 0.6 0.8  PROT 7.7 6.4*  ALBUMIN 3.2* 2.6*   No results for input(s): LIPASE, AMYLASE in the last 168 hours. Recent Labs  Lab 08/03/20 0109   AMMONIA 21   Coagulation Profile: Recent Labs  Lab 08/02/20 1215 08/03/20 0403  INR 1.1 1.1   Cardiac Enzymes: No results for input(s): CKTOTAL, CKMB, CKMBINDEX, TROPONINI in the last 168 hours. BNP (last 3 results) No results for input(s): PROBNP in the last 8760 hours. HbA1C: Recent Labs    08/03/20 0024  HGBA1C 8.0*   CBG: Recent Labs  Lab 08/03/20 1221 08/03/20 1556 08/03/20 1946 08/03/20 2341 08/04/20 0435  GLUCAP 176* 142* 103* 104* 104*   Lipid Profile: No results for input(s): CHOL, HDL, LDLCALC, TRIG, CHOLHDL, LDLDIRECT in the last 72 hours. Thyroid Function Tests: No results for input(s): TSH, T4TOTAL, FREET4, T3FREE, THYROIDAB in the last 72 hours. Anemia Panel: No results for input(s): VITAMINB12, FOLATE, FERRITIN, TIBC, IRON, RETICCTPCT in the last 72 hours. Sepsis Labs: No results for input(s): PROCALCITON, LATICACIDVEN in the last 168 hours.  Recent Results (from the past 240 hour(s))  Resp Panel by RT-PCR (Flu A&B, Covid) Nasopharyngeal Swab     Status: None   Collection Time: 08/02/20 12:13 PM   Specimen: Nasopharyngeal Swab; Nasopharyngeal(NP) swabs in vial transport medium  Result Value Ref Range Status   SARS Coronavirus 2 by RT PCR NEGATIVE NEGATIVE Final    Comment: (NOTE) SARS-CoV-2 target nucleic acids are NOT DETECTED.  The SARS-CoV-2 RNA is generally detectable in upper respiratory specimens during the acute phase of infection. The lowest concentration of SARS-CoV-2 viral copies this assay can detect is 138 copies/mL. A negative result does not preclude SARS-Cov-2 infection and should not be used as the sole basis for treatment or other patient management decisions. A negative result may occur with  improper specimen collection/handling, submission of specimen other than nasopharyngeal swab, presence of viral mutation(s) within the areas targeted by this assay, and inadequate number of viral copies(<138 copies/mL). A negative result  must be combined with clinical observations, patient history, and epidemiological information. The expected result is Negative.  Fact Sheet for Patients:  BloggerCourse.com  Fact Sheet for Healthcare Providers:  SeriousBroker.it  This test is no t yet approved or cleared by the [place] FDA and  has been authorized for detection and/or diagnosis of SARS-CoV-2 by FDA under an Emergency Use Authorization (EUA). This EUA will remain  in effect (meaning this test can be used) for the duration of the COVID-19 declaration under Section 564(b)(1) of the Act, 21 U.S.C.section 360bbb-3(b)(1), unless the authorization is terminated  or revoked sooner.       Influenza A by PCR NEGATIVE NEGATIVE Final   Influenza B by PCR NEGATIVE NEGATIVE Final    Comment: (NOTE) The Xpert Xpress SARS-CoV-2/FLU/RSV plus assay is intended as an aid in the diagnosis of influenza from Nasopharyngeal swab specimens and should not be used as a sole basis for treatment. Nasal washings  and aspirates are unacceptable for Xpert Xpress SARS-CoV-2/FLU/RSV testing.  Fact Sheet for Patients: BloggerCourse.comhttps://www.fda.gov/media/152166/download  Fact Sheet for Healthcare Providers: SeriousBroker.ithttps://www.fda.gov/media/152162/download  This test is not yet approved or cleared by the [place] FDA and has been authorized for detection and/or diagnosis of SARS-CoV-2 by FDA under an Emergency Use Authorization (EUA). This EUA will remain in effect (meaning this test can be used) for the duration of the COVID-19 declaration under Section 564(b)(1) of the Act, 21 U.S.C. section 360bbb-3(b)(1), unless the authorization is terminated or revoked.  Performed at [facility], [address] [place], [place] [address]   MRSA PCR Screening     Status: None   Collection Time: 08/03/20  9:10 AM   Specimen: Nasopharyngeal  Result Value Ref Range Status   MRSA by PCR NEGATIVE  NEGATIVE Final    Comment:        The GeneXpert MRSA Assay (FDA approved for NASAL specimens only), is one component of a comprehensive MRSA colonization surveillance program. It is not intended to diagnose MRSA infection nor to guide or monitor treatment for MRSA infections. Performed at [facility], [address] [place], [place] [address]          Radiology Studies: CT HEAD WO CONTRAST  Result Date: 08/02/2020 CLINICAL DATA:  Mental status change EXAM: CT HEAD WITHOUT CONTRAST TECHNIQUE: Contiguous axial images were obtained from the base of the skull through the vertex without intravenous contrast. COMPARISON:  None. FINDINGS: Brain: No evidence of acute infarction, hemorrhage, hydrocephalus, extra-axial collection or mass lesion/mass effect. Vascular: No hyperdense vessel or unexpected calcification. Skull: Normal. Negative for fracture or focal lesion. Sinuses/Orbits: No acute finding. Other: None. IMPRESSION: Negative non contrasted CT appearance of the brain. Electronically Signed   By: Jasmine PangKim  Fujinaga M.D.   On: 08/02/2020 19:41   CT Angio Chest PE W and/or Wo Contrast  Result Date: 08/02/2020 CLINICAL DATA:  PE suspected, high prob Hypoxia. EXAM: CT ANGIOGRAPHY CHEST WITH CONTRAST TECHNIQUE: Multidetector CT imaging of the chest was performed using the standard protocol during bolus administration of intravenous contrast. Multiplanar CT image reconstructions and MIPs were obtained to evaluate the vascular anatomy. CONTRAST:  75mL OMNIPAQUE IOHEXOL 350 MG/ML SOLN COMPARISON:  Radiograph earlier today. FINDINGS: Cardiovascular: Technically limited exam due to soft tissue attenuate patient from habitus and contrast bolus timing. Evaluation is diagnostic to the proximal segmental levels. There are no filling defects within the pulmonary arteries to suggest pulmonary embolus. The distal segmental and subsegmental branches are not well assessed. Dilatation of the main  pulmonary artery at 3.7 cm. Mild aortic atherosclerosis. No aortic aneurysm. No evidence of dissection allowing for phase of contrast tailored to pulmonary artery evaluation. Borderline cardiomegaly. No pericardial effusion. Mediastinum/Nodes: No enlarged mediastinal or hilar lymph nodes. There is mediastinal lipomatosis. Heterogeneous thyroid gland with a punctate calcification in the left lobe, no visualized focal nodule. Lungs/Pleura: Dependent opacities in both lower lungs typical of hypoventilatory atelectasis. There is mild compressive atelectasis in the right middle lobe adjacent to elevated right hemidiaphragm. Slight heterogeneous pulmonary parenchyma. No confluent consolidation. No septal thickening. No pulmonary mass or evident nodule. No pleural fluid. Upper Abdomen: Enlarged liver with steatosis, partially included. No acute findings in the upper abdomen. Musculoskeletal: Lower thoracic spondylosis. There are no acute or suspicious osseous abnormalities. Review of the MIP images confirms the above findings. IMPRESSION: 1. No central pulmonary embolus. The distal segmental and subsegmental branches are not well assessed. 2. Dilatation of the main pulmonary artery suggesting pulmonary  arterial hypertension. 3. Slight heterogeneous pulmonary parenchyma, can be seen with small airways disease. Hypoventilatory atelectasis in both lungs. Aortic Atherosclerosis (ICD10-I70.0). Electronically Signed   By: Narda Rutherford M.D.   On: 08/02/2020 15:47   DG Chest Portable 1 View  Result Date: 08/02/2020 CLINICAL DATA:  Shortness of breath EXAM: PORTABLE CHEST 1 VIEW COMPARISON:  May 27, 2020 FINDINGS: The lungs are clear. Heart is borderline enlarged with pulmonary vascularity normal. No adenopathy. No bone lesions. IMPRESSION: Borderline cardiac enlargement.  Lungs clear. Electronically Signed   By: Bretta Bang III M.D.   On: 08/02/2020 12:48   ECHOCARDIOGRAM COMPLETE  Result Date: 08/03/2020     ECHOCARDIOGRAM REPORT   Patient Name:   Nial ANTONIO CRUZ CORTES Date of Exam: 08/03/2020 Medical Rec #:  [id number]                   Height:       66.0 in Accession #:    [id number]                  Weight:       361.0 lb Date of Birth:  [birth year]                    BSA:          2.570 m Patient Age:    49 years                    BP:           144/70 mmHg Patient Gender: M                           HR:           73 bpm. Exam Location:  [facility] Procedure: 2D Echo, Color Doppler, Cardiac Doppler and Intracardiac            Opacification Agent Indications:     I21.4 NSTEMI  History:         Patient has prior history of Echocardiogram examinations, most                  recent 07/17/2020. Risk Factors:Hypertension and Diabetes.  Sonographer:     Humphrey Rolls RDCS (AE) Referring Phys:  [id number] Tonye Royalty Diagnosing Phys: Julien Nordmann MD  Sonographer Comments: Technically difficult study due to poor echo windows. Image acquisition challenging due to patient body habitus. IMPRESSIONS  1. Left ventricular ejection fraction, by estimation, is 60 to 65%. The left ventricle has normal function. The left ventricle has no regional wall motion abnormalities. Left ventricular diastolic parameters are consistent with Grade I diastolic dysfunction (impaired relaxation).  2. Right ventricular systolic function is normal. The right ventricular size is normal. FINDINGS  Left Ventricle: Left ventricular ejection fraction, by estimation, is 60 to 65%. The left ventricle has normal function. The left ventricle has no regional wall motion abnormalities. Definity contrast agent was given IV to delineate the left ventricular  endocardial borders. The left ventricular internal cavity size was normal in size. There is no left ventricular hypertrophy. Left ventricular diastolic parameters are consistent with Grade I diastolic dysfunction (impaired relaxation). Right Ventricle: The right ventricular size is normal. No increase in right  ventricular wall thickness. Right ventricular systolic function is normal. Left Atrium: Left atrial size was normal in size. Right Atrium: Right atrial size was normal in size. Pericardium: There is no evidence of pericardial  effusion. Mitral Valve: The mitral valve is normal in structure. No evidence of mitral valve regurgitation. No evidence of mitral valve stenosis. MV peak gradient, 4.8 mmHg. The mean mitral valve gradient is 2.0 mmHg. Tricuspid Valve: The tricuspid valve is normal in structure. Tricuspid valve regurgitation is not demonstrated. No evidence of tricuspid stenosis. Aortic Valve: The aortic valve is normal in structure. Aortic valve regurgitation is not visualized. No aortic stenosis is present. Aortic valve mean gradient measures 4.0 mmHg. Aortic valve peak gradient measures 9.2 mmHg. Aortic valve area, by VTI measures 3.99 cm. Pulmonic Valve: The pulmonic valve was normal in structure. Pulmonic valve regurgitation is not visualized. No evidence of pulmonic stenosis. Aorta: The aortic root is normal in size and structure. Venous: The inferior vena cava is normal in size with greater than 50% respiratory variability, suggesting right atrial pressure of 3 mmHg. IAS/Shunts: No atrial level shunt detected by color flow Doppler.  LEFT VENTRICLE PLAX 2D LVIDd:         4.82 cm      Diastology LVIDs:         3.04 cm      LV e' medial:    8.59 cm/s LV PW:         0.91 cm      LV E/e' medial:  10.3 LV IVS:        0.91 cm      LV e' lateral:   9.25 cm/s LVOT diam:     2.20 cm      LV E/e' lateral: 9.5 LV SV:         112 LV SV Index:   43 LVOT Area:     3.80 cm  LV Volumes (MOD) LV vol d, MOD A2C: 128.0 ml LV vol d, MOD A4C: 136.0 ml LV vol s, MOD A2C: 43.0 ml LV vol s, MOD A4C: 46.4 ml LV SV MOD A2C:     85.0 ml LV SV MOD A4C:     136.0 ml LV SV MOD BP:      89.5 ml LEFT ATRIUM           Index LA Vol (A4C): 27.5 ml 10.70 ml/m  AORTIC VALVE                   PULMONIC VALVE AV Area (Vmax):    3.75 cm    PV  Vmax:       1.05 m/s AV Area (Vmean):   3.69 cm    PV Vmean:      69.500 cm/s AV Area (VTI):     3.99 cm    PV VTI:        0.210 m AV Vmax:           152.00 cm/s PV Peak grad:  4.4 mmHg AV Vmean:          97.300 cm/s PV Mean grad:  2.0 mmHg AV VTI:            0.280 m AV Peak Grad:      9.2 mmHg AV Mean Grad:      4.0 mmHg LVOT Vmax:         150.00 cm/s LVOT Vmean:        94.400 cm/s LVOT VTI:          0.294 m LVOT/AV VTI ratio: 1.05  AORTA Ao Root diam: 2.80 cm MITRAL VALVE MV Area (PHT): 3.21 cm    SHUNTS MV Peak grad:  4.8 mmHg  Systemic VTI:  0.29 m MV Mean grad:  2.0 mmHg    Systemic Diam: 2.20 cm MV Vmax:       1.10 m/s MV Vmean:      69.3 cm/s MV Decel Time: 236 msec MV E velocity: 88.30 cm/s MV A velocity: 84.40 cm/s MV E/A ratio:  1.05 Julien Nordmann MD Electronically signed by Julien Nordmann MD Signature Date/Time: 08/03/2020/4:37:06 PM    Final         Scheduled Meds: . aspirin EC  81 mg Oral Daily  . atorvastatin  10 mg Oral Daily  . carvedilol  3.125 mg Oral BID WC  . chlorhexidine  15 mL Mouth Rinse BID  . Chlorhexidine Gluconate Cloth  6 each Topical Daily  . furosemide  40 mg Oral BID  . insulin aspart  0-15 Units Subcutaneous Q4H  . lisinopril  20 mg Oral Daily  . mouth rinse  15 mL Mouth Rinse q12n4p  . pneumococcal 23 valent vaccine  0.5 mL Intramuscular Tomorrow-1000  . spironolactone  25 mg Oral Daily   Continuous Infusions: . heparin 1,850 Units/hr (08/03/20 2240)     LOS: 2 days    Time spent: 31 mins    Charise Killian, MD [facility] Pager [phone]  If 7PM-7AM, please contact night-coverage 08/04/2020, 7:24 AM

## 2020-08-05 DIAGNOSIS — I5033 Acute on chronic diastolic (congestive) heart failure: Secondary | ICD-10-CM

## 2020-08-05 LAB — COMPREHENSIVE METABOLIC PANEL
ALT: 80 U/L — ABNORMAL HIGH (ref 0–44)
AST: 38 U/L (ref 15–41)
Albumin: 2.7 g/dL — ABNORMAL LOW (ref 3.5–5.0)
Alkaline Phosphatase: 65 U/L (ref 38–126)
Anion gap: 7 (ref 5–15)
BUN: 16 mg/dL (ref 6–20)
CO2: 39 mmol/L — ABNORMAL HIGH (ref 22–32)
Calcium: 8.7 mg/dL — ABNORMAL LOW (ref 8.9–10.3)
Chloride: 91 mmol/L — ABNORMAL LOW (ref 98–111)
Creatinine, Ser: 0.89 mg/dL (ref 0.61–1.24)
GFR, Estimated: >60 mL/min (ref >60)
Glucose, Bld: 95 mg/dL (ref 70–99)
Potassium: 4.1 mmol/L (ref 3.5–5.1)
Sodium: 137 mmol/L (ref 135–145)
Total Bilirubin: 0.7 mg/dL (ref 0.3–1.2)
Total Protein: 6.9 g/dL (ref 6.5–8.1)

## 2020-08-05 LAB — CBC
HCT: 47.3 % (ref 39.0–52.0)
Hemoglobin: 14.3 g/dL (ref 13.0–17.0)
MCH: 26 pg (ref 26.0–34.0)
MCHC: 30.2 g/dL (ref 30.0–36.0)
MCV: 86.2 fL (ref 80.0–100.0)
Platelets: 285 10*3/uL (ref 150–400)
RBC: 5.49 MIL/uL (ref 4.22–5.81)
RDW: 15.2 % (ref 11.5–15.5)
WBC: 9.2 10*3/uL (ref 4.0–10.5)
nRBC: 0 % (ref 0.0–0.2)

## 2020-08-05 LAB — GLUCOSE, CAPILLARY
Glucose-Capillary: 80 mg/dL (ref 70–99)
Glucose-Capillary: 133 mg/dL — ABNORMAL HIGH (ref 70–99)
Glucose-Capillary: 113 mg/dL — ABNORMAL HIGH (ref 70–99)

## 2020-08-05 MED ORDER — ENOXAPARIN SODIUM 80 MG/0.8ML ~~LOC~~ SOLN: 0.5000 mg/kg | SUBCUTANEOUS | Status: DC

## 2020-08-05 MED ORDER — ASPIRIN 81 MG PO TBEC: 81.0000 mg | DELAYED_RELEASE_TABLET | Freq: Every day | ORAL | 0 refills | Status: AC

## 2020-08-05 NOTE — Evaluation (Signed)
Physical Therapy Evaluation Patient Details Name: Benjamin Moran MRN: [id number] DOB: August 08, 1971 Today's Date: 08/05/2020   History of Present Illness  Benjamin Moran is a 49 y.o. male with a known history of DM, HTN, who presents to the emergency department with complaints of shortness of breath, fatigue, and chest pain. On arrival to ED O2 sats were found to be 30% but rebounded to 80's on 5L. He was found to be hypercapneic and was placed on bipap for acute hypoxic respiratory failure. Pt admitted for further management.  Clinical Impression  Pt was seen for gait and then stair training, with struggle to get and keep interpretation help on ipad.  Pt is motivated, very interested in getting home and avoiding issues with his breathing.  He is able to maintain O2 sats with lowest value observed post mobility at 90%.  Pt recovers rapidly since he is on 3L O2.  O2 was delivered to go home,  Pt will continue on with therapy as his stay allows.  Follow for goals of PT.    Follow Up Recommendations Home health PT;Supervision for mobility/OOB    Equipment Recommendations  Rolling walker with 5" wheels    Recommendations for Other Services       Precautions / Restrictions Precautions Precautions: Fall Precaution Comments: Monitor O2 sats Restrictions Weight Bearing Restrictions: No Other Position/Activity Restrictions: Monitor HR; O2      Mobility  Bed Mobility Overal bed mobility: Needs Assistance Bed Mobility: Supine to Sit     Supine to sit: Min assist     General bed mobility comments: min assist to support trunk to sit up    Transfers Overall transfer level: Needs assistance Equipment used: 1 person hand held assist Transfers: Sit to/from Stand Sit to Stand: Supervision            Ambulation/Gait Ambulation/Gait assistance: Min guard Gait Distance (Feet): 100 Feet Assistive device: 1 person hand held assist Gait Pattern/deviations: Step-through  pattern;Decreased stride length;Wide base of support Gait velocity: reduced Gait velocity interpretation: <1.31 ft/sec, indicative of household ambulator General Gait Details: pt is up to walk without WB support, just min guard for safety and monitoring of sats  Stairs            Wheelchair Mobility    Modified Rankin (Stroke Patients Only)       Balance Overall balance assessment: Needs assistance Sitting-balance support: Feet supported Sitting balance-Leahy Scale: Good     Standing balance support: Single extremity supported Standing balance-Leahy Scale: Good                               Pertinent Vitals/Pain Pain Assessment: No/denies pain    Home Living Family/patient expects to be discharged to:: Private residence Living Arrangements: Spouse/significant other;Children Available Help at Discharge: Family;Available 24 hours/day Type of Home: House Home Access: Stairs to enter Entrance Stairs-Rails: Right Entrance Stairs-Number of Steps: 2 Home Layout: One level Home Equipment: Cane - single point Additional Comments: O2 upport recommended previously    Prior Function Level of Independence: Independent         Comments: drove, independent walking     Hand Dominance   Dominant Hand: Right    Extremity/Trunk Assessment   Upper Extremity Assessment Upper Extremity Assessment: Defer to OT evaluation    Lower Extremity Assessment Lower Extremity Assessment: Overall WFL for tasks assessed    Cervical / Trunk Assessment Cervical / Trunk Assessment:  Normal  Communication   Communication: Prefers language other than English  Cognition Arousal/Alertness: Awake/alert Behavior During Therapy: WFL for tasks assessed/performed Overall Cognitive Status: Within Functional Limits for tasks assessed                                        General Comments General comments (skin integrity, edema, etc.): pt was assisted to walk  with 3L O2, note sats dropped from 96% to 91% with moving    Exercises     Assessment/Plan    PT Assessment Patient needs continued PT services  PT Problem List Decreased strength;Decreased activity tolerance;Decreased balance;Decreased safety awareness;Cardiopulmonary status limiting activity;Obesity       PT Treatment Interventions DME instruction;Gait training;Stair training;Functional mobility training;Therapeutic activities;Therapeutic exercise;Balance training;Neuromuscular re-education;Patient/family education    PT Goals (Current goals can be found in the Care Plan section)  Acute Rehab PT Goals Patient Stated Goal: To go home PT Goal Formulation: With patient Time For Goal Achievement: 08/12/20 Potential to Achieve Goals: Good    Frequency Min 2X/week   Barriers to discharge Inaccessible home environment stairs to enter and go upstairs    Co-evaluation               AM-PAC PT "6 Clicks" Mobility  Outcome Measure Help needed turning from your back to your side while in a flat bed without using bedrails?: A Little Help needed moving from lying on your back to sitting on the side of a flat bed without using bedrails?: A Little Help needed moving to and from a bed to a chair (including a wheelchair)?: A Little Help needed standing up from a chair using your arms (e.g., wheelchair or bedside chair)?: A Little Help needed to walk in hospital room?: A Little Help needed climbing 3-5 steps with a railing? : A Little 6 Click Score: 18    End of Session Equipment Utilized During Treatment: Oxygen Activity Tolerance: Patient tolerated treatment well;Treatment limited secondary to medical complications (Comment) Patient left: in chair;with call bell/phone within reach;with chair alarm set Nurse Communication: Mobility status PT Visit Diagnosis: Unsteadiness on feet (R26.81);Muscle weakness (generalized) (M62.81);Difficulty in walking, not elsewhere classified (R26.2)     Time: 5916-3846 PT Time Calculation (min) (ACUTE ONLY): 53 min   Charges:   PT Evaluation $PT Eval Moderate Complexity: 1 Mod PT Treatments $Gait Training: 23-37 mins $Therapeutic Exercise: 8-22 mins       Ivar Drape 08/05/2020, 7:12 PM  Samul Dada, PT MS Acute Rehab Dept. Number: [facility] [id number] and [facility] [phone]

## 2020-08-05 NOTE — Progress Notes (Signed)
PHARMACIST - PHYSICIAN COMMUNICATION  CONCERNING:  Enoxaparin (Lovenox) for DVT Prophylaxis    RECOMMENDATION:   Filed Weights   08/04/20 0016 08/04/20 0435 08/05/20 0442  Weight: (!) 167.3 kg (368 lb 14.4 oz) (!) 167.3 kg (368 lb 14.4 oz) (!) 154.7 kg (341 lb)    Body mass index is 55.04 kg/m.  Estimated Creatinine Clearance: 139.2 mL/min (by C-G formula based on SCr of 0.89 mg/dL).  Weight has been updated in Epic  Based on [facility] policy patient is candidate for enoxaparin 0.5mg /kg TBW SQ every 24 hours based on BMI being >30.  DESCRIPTION: Pharmacy has adjusted enoxaparin dose per [facility] policy.  Patient is now receiving enoxaparin 77.5 mg every 24 hours    Gardner Candle, PharmD, BCPS Clinical Pharmacist 08/05/2020 10:16 AM

## 2020-08-05 NOTE — Progress Notes (Signed)
SATURATION QUALIFICATIONS: (This note is used to comply with regulatory documentation for home oxygen)  Patient Saturations on Room Air at Rest = 85%  Patient Saturations on Room Air while Ambulating = 85%  Patient Saturations on 2 Liters of oxygen while Ambulating = 93%  Please briefly explain why patient needs home oxygen: At rest on room air. Patient oxygen drops to 85%

## 2020-08-05 NOTE — TOC Transition Note (Addendum)
Transition of Care [facility]) - CM/SW Discharge Note   Patient Details  Name: Benjamin Moran MRN: [id number] Date of Birth: 24-Sep-1970  [facility] CM/SW Contact:  Luvenia Redden, RN Phone Number:[phone] 08/05/2020, 11:17 AM   Clinical Narrative:    Pt to be discharged today with the request for Home O2 (MCD potential). 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 Requested O2 testing and referral accepted via [employer] who will deliver to pt's room today. Also provided primary providers list to accommodate pt need for a doctor. No other needs requested at this time.   TOC team will continue to follow.     Addendum: [facility] [place]) will services pt for PT/OT as the charitable agency for [facility].     Patient Goals and CMS Choice        Discharge Placement                       Discharge Plan and Services                                     Social Determinants of Health (SDOH) Interventions     Readmission Risk Interventions No flowsheet data found.

## 2020-08-05 NOTE — Plan of Care (Signed)
Pt ready for discharge.  IV and tele removed Pt educated on discharge planning Interpreter used for discharge teaching Time allowed for questions and concerns Verbalizes an understanding on all teaching Oxygen delivered to bedside and patient educated on use Denies any additional wants or needs at this time Awaiting transport home  Problem: Education: Goal: Knowledge of General Education information will improve Description: Including pain rating scale, medication(s)/side effects and non-pharmacologic comfort measures Outcome: Adequate for Discharge   Problem: Health Behavior/Discharge Planning: Goal: Ability to manage health-related needs will improve Outcome: Adequate for Discharge   Problem: Clinical Measurements: Goal: Ability to maintain clinical measurements within normal limits will improve Outcome: Adequate for Discharge Goal: Will remain free from infection Outcome: Adequate for Discharge Goal: Diagnostic test results will improve Outcome: Adequate for Discharge Goal: Respiratory complications will improve Outcome: Adequate for Discharge Goal: Cardiovascular complication will be avoided Outcome: Adequate for Discharge   Problem: Activity: Goal: Risk for activity intolerance will decrease Outcome: Adequate for Discharge   Problem: Nutrition: Goal: Adequate nutrition will be maintained Outcome: Adequate for Discharge   Problem: Coping: Goal: Level of anxiety will decrease Outcome: Adequate for Discharge   Problem: Elimination: Goal: Will not experience complications related to bowel motility Outcome: Adequate for Discharge Goal: Will not experience complications related to urinary retention Outcome: Adequate for Discharge   Problem: Pain Managment: Goal: General experience of comfort will improve Outcome: Adequate for Discharge   Problem: Safety: Goal: Ability to remain free from injury will improve Outcome: Adequate for Discharge   Problem: Skin  Integrity: Goal: Risk for impaired skin integrity will decrease Outcome: Adequate for Discharge

## 2020-08-05 NOTE — Discharge Summary (Signed)
Physician Discharge Summary  Benjamin Moran ZOX:[id number] DOB: 08/20/1970 DOA: 08/02/2020  PCP: [facility]  Admit date: 08/02/2020 Discharge date: 08/05/2020  Admitted From: home  Disposition: home w/ home health   Recommendations for Outpatient Follow-up:  1. Follow up with PCP in 1-2 weeks 2. F/u w/ cardio, Dr. Juliann Pares, within 1 week   Home Health: yes Equipment/Devices: 2L [place]  Discharge Condition: stable CODE STATUS: full  Diet recommendation: Heart Healthy / Carb Modified  Brief/Interim Summary: HPI was taken from Dr. Emmit Pomfret: Benjamin Moran is a 49 y.o. male with a known history of DM, HTN, OSA recently diagnosed but has not yet obtained his CPAP presents to the emergency department for evaluation of SOB.  Patient was in a usual state of health until lunchtime on Tues 12/14.  Patient's wife states that he complained of chest pain and shortness of breath as well as extreme fatigue.  He became very lethargic, slept all day.  When he was awake he was "acting like a robot," walking around aimlessly, confused and incoherent.  She brought him here because he was attempting to drive but kept falling asleep.  His O2 sats were initially found to be 30%, but came up to mid 80s on 5L.Marland Kitchen He was found to be hypercapneic and therefore was placed on bipap for acute hypoxic respiratory failure.   He is only responsive to painful stimuli and gives no history.  Wife denies drug use, trauma.  Patient denies fevers/chills, weakness, dizziness,  N/V/C/D, abdominal pain, dysuria/frequency, changes in mental status.    Otherwise there has been no change in status. Patient has been taking medication as prescribed and there has been no recent change in medication or diet.  No recent antibiotics.  There has been no recent illness, hospitalizations, travel or sick contacts.    EMS/ED Course: Medical admission has been requested for further management of Acute  hypoxic respiratory failure with hypercapnia, NSTEMI. Marland Kitchen  Hospital Course from Dr. Mayford Knife: Pt presented w/ shortness of breath and found to have acute respiratory failure likely secondary to acute on chronic CHF exacerbation. Pt was treated w/ IV lasix, coreg, spironolactone, & lisinopril as well as monitoring I/Os. Pt initially required BiPAP but pt was able to be weaned off prior to d/c. Unfortunately, pt could not be weaned from supplemental oxygen so pt was d/c home w/ 2L [place]. PT/OT saw the pt and recommended home health. Home health was set up by CM prior to d/c via charity as the pt does not have any insurance. For more information please see other progress/consult notes.   Discharge Diagnoses:  Active Problems:   Acute respiratory failure with hypoxia (HCC)  Acute hypoxic & hypercapnic respiratory failure: initially required BiPAP but it has since been weaned off. Urine tox is neg. CT brain shows no acute intracranial findings. Unable to wean oxygen so pt will be d/c w/ 2L [place].  Needs a sleep study as an outpatient   Acute on chronic diastolic CHF exacerbation: echo shows EF 60-65%, grade I diastolic dysfunction, & no wall motion abnormalities. Continue on lasix, coreg, spironolactone & lisinopril. Monitor I/Os   Acute metabolic encephalopathy: likely secondary to hypercapnia. Resolved  Elevated troponins: likely secondary to demand ischemia. NSTEMI r/o. D/c IV heparin drip. Continue on tele. Cardio following   DM2: poorly controlled. Continue to hold home dose of metformin, victoza. Continue on SSI w/ accuchecks.   HTN: continue on home dose of coreg, lisinopril, & spironolactone  HLD:  continue on statin  Transaminitis: etiology unclear, AST is WNL, ALT is still elevated but trending down.   Leukocytosis: likely reactive, trending down. Will continue to monitor   Morbid obesity: BMI 58.2. Complicates overall care and prognosis. Would benefit from significant weight loss    Discharge Instructions  Discharge Instructions    Diet - low sodium heart healthy   Complete by: As directed    Diet Carb Modified   Complete by: As directed    Discharge instructions   Complete by: As directed    F/u w/ cardio, Dr. Lady GaryFath, in 1 week. F/u w/ PCP in 1-2 weeks   Increase activity slowly   Complete by: As directed      Allergies as of 08/05/2020      Reactions   Penicillins Hives   Other reaction(s): Unknown      Medication List    TAKE these medications   aspirin 81 MG EC tablet Take 1 tablet (81 mg total) by mouth daily. Swallow whole. Start taking on: August 06, 2020   atorvastatin 10 MG tablet Commonly known as: LIPITOR Take 10 mg by mouth daily.   carvedilol 3.125 MG tablet Commonly known as: COREG Take 3.125 mg by mouth 2 (two) times daily with a meal.   furosemide 40 MG tablet Commonly known as: Lasix Take 1 tablet (40 mg total) by mouth 2 (two) times daily.   liraglutide 18 MG/3ML Sopn Commonly known as: VICTOZA Inject 18 mg into the skin.   lisinopril 20 MG tablet Commonly known as: ZESTRIL Take 20 mg by mouth daily.   metFORMIN 500 MG tablet Commonly known as: GLUCOPHAGE Take 500 mg by mouth 2 (two) times daily with a meal.   spironolactone 25 MG tablet Commonly known as: ALDACTONE Take 25 mg by mouth daily.            Durable Medical Equipment  (From admission, onward)         Start     Ordered   08/05/20 1053  For home use only DME oxygen  Once       Question Answer Comment  Length of Need 6 Months   Mode or (Route) Nasal cannula   Liters per Minute 2   Frequency Continuous (stationary and portable oxygen unit needed)   Oxygen conserving device Yes   Oxygen delivery system Gas      08/05/20 1052          Follow-up Information    Callwood, Dwayne D, MD Follow up in 1 week(s).   Specialties: Cardiology, Internal Medicine Contact information: [address] [name] [address] [phone]               Allergies  Allergen Reactions  . Penicillins Hives    Other reaction(s): Unknown    Consultations:  Cardio, Dr. Juliann Paresallwood    Procedures/Studies: CT HEAD WO CONTRAST  Result Date: 08/02/2020 CLINICAL DATA:  Mental status change EXAM: CT HEAD WITHOUT CONTRAST TECHNIQUE: Contiguous axial images were obtained from the base of the skull through the vertex without intravenous contrast. COMPARISON:  None. FINDINGS: Brain: No evidence of acute infarction, hemorrhage, hydrocephalus, extra-axial collection or mass lesion/mass effect. Vascular: No hyperdense vessel or unexpected calcification. Skull: Normal. Negative for fracture or focal lesion. Sinuses/Orbits: No acute finding. Other: None. IMPRESSION: Negative non contrasted CT appearance of the brain. Electronically Signed   By: Jasmine PangKim  Fujinaga M.D.   On: 08/02/2020 19:41   CT Angio Chest PE W and/or Wo Contrast  Result Date: 08/02/2020 CLINICAL DATA:  PE suspected, high prob Hypoxia. EXAM: CT ANGIOGRAPHY CHEST WITH CONTRAST TECHNIQUE: Multidetector CT imaging of the chest was performed using the standard protocol during bolus administration of intravenous contrast. Multiplanar CT image reconstructions and MIPs were obtained to evaluate the vascular anatomy. CONTRAST:  19mL OMNIPAQUE IOHEXOL 350 MG/ML SOLN COMPARISON:  Radiograph earlier today. FINDINGS: Cardiovascular: Technically limited exam due to soft tissue attenuate patient from habitus and contrast bolus timing. Evaluation is diagnostic to the proximal segmental levels. There are no filling defects within the pulmonary arteries to suggest pulmonary embolus. The distal segmental and subsegmental branches are not well assessed. Dilatation of the main pulmonary artery at 3.7 cm. Mild aortic atherosclerosis. No aortic aneurysm. No evidence of dissection allowing for phase of contrast tailored to pulmonary artery evaluation. Borderline cardiomegaly. No pericardial effusion.  Mediastinum/Nodes: No enlarged mediastinal or hilar lymph nodes. There is mediastinal lipomatosis. Heterogeneous thyroid gland with a punctate calcification in the left lobe, no visualized focal nodule. Lungs/Pleura: Dependent opacities in both lower lungs typical of hypoventilatory atelectasis. There is mild compressive atelectasis in the right middle lobe adjacent to elevated right hemidiaphragm. Slight heterogeneous pulmonary parenchyma. No confluent consolidation. No septal thickening. No pulmonary mass or evident nodule. No pleural fluid. Upper Abdomen: Enlarged liver with steatosis, partially included. No acute findings in the upper abdomen. Musculoskeletal: Lower thoracic spondylosis. There are no acute or suspicious osseous abnormalities. Review of the MIP images confirms the above findings. IMPRESSION: 1. No central pulmonary embolus. The distal segmental and subsegmental branches are not well assessed. 2. Dilatation of the main pulmonary artery suggesting pulmonary arterial hypertension. 3. Slight heterogeneous pulmonary parenchyma, can be seen with small airways disease. Hypoventilatory atelectasis in both lungs. Aortic Atherosclerosis (ICD10-I70.0). Electronically Signed   By: Narda Rutherford M.D.   On: 08/02/2020 15:47   DG Chest Portable 1 View  Result Date: 08/02/2020 CLINICAL DATA:  Shortness of breath EXAM: PORTABLE CHEST 1 VIEW COMPARISON:  May 27, 2020 FINDINGS: The lungs are clear. Heart is borderline enlarged with pulmonary vascularity normal. No adenopathy. No bone lesions. IMPRESSION: Borderline cardiac enlargement.  Lungs clear. Electronically Signed   By: Bretta Bang III M.D.   On: 08/02/2020 12:48   ECHOCARDIOGRAM COMPLETE  Result Date: 08/03/2020    ECHOCARDIOGRAM REPORT   Patient Name:   Benjamin Moran Date of Exam: 08/03/2020 Medical Rec #:  [id number]                   Height:       66.0 in Accession #:    [id number]                  Weight:       361.0  lb Date of Birth:  [birth year]                    BSA:          2.570 m Patient Age:    49 years                    BP:           144/70 mmHg Patient Gender: M                           HR:           73 bpm. Exam Location:  [facility] Procedure: 2D Echo, Color Doppler, Cardiac  Doppler and Intracardiac            Opacification Agent Indications:     I21.4 NSTEMI  History:         Patient has prior history of Echocardiogram examinations, most                  recent 07/17/2020. Risk Factors:Hypertension and Diabetes.  Sonographer:     Humphrey Rolls RDCS (AE) Referring Phys:  [id number] Tonye Royalty Diagnosing Phys: Julien Nordmann MD  Sonographer Comments: Technically difficult study due to poor echo windows. Image acquisition challenging due to patient body habitus. IMPRESSIONS  1. Left ventricular ejection fraction, by estimation, is 60 to 65%. The left ventricle has normal function. The left ventricle has no regional wall motion abnormalities. Left ventricular diastolic parameters are consistent with Grade I diastolic dysfunction (impaired relaxation).  2. Right ventricular systolic function is normal. The right ventricular size is normal. FINDINGS  Left Ventricle: Left ventricular ejection fraction, by estimation, is 60 to 65%. The left ventricle has normal function. The left ventricle has no regional wall motion abnormalities. Definity contrast agent was given IV to delineate the left ventricular  endocardial borders. The left ventricular internal cavity size was normal in size. There is no left ventricular hypertrophy. Left ventricular diastolic parameters are consistent with Grade I diastolic dysfunction (impaired relaxation). Right Ventricle: The right ventricular size is normal. No increase in right ventricular wall thickness. Right ventricular systolic function is normal. Left Atrium: Left atrial size was normal in size. Right Atrium: Right atrial size was normal in size. Pericardium: There is no evidence of  pericardial effusion. Mitral Valve: The mitral valve is normal in structure. No evidence of mitral valve regurgitation. No evidence of mitral valve stenosis. MV peak gradient, 4.8 mmHg. The mean mitral valve gradient is 2.0 mmHg. Tricuspid Valve: The tricuspid valve is normal in structure. Tricuspid valve regurgitation is not demonstrated. No evidence of tricuspid stenosis. Aortic Valve: The aortic valve is normal in structure. Aortic valve regurgitation is not visualized. No aortic stenosis is present. Aortic valve mean gradient measures 4.0 mmHg. Aortic valve peak gradient measures 9.2 mmHg. Aortic valve area, by VTI measures 3.99 cm. Pulmonic Valve: The pulmonic valve was normal in structure. Pulmonic valve regurgitation is not visualized. No evidence of pulmonic stenosis. Aorta: The aortic root is normal in size and structure. Venous: The inferior vena cava is normal in size with greater than 50% respiratory variability, suggesting right atrial pressure of 3 mmHg. IAS/Shunts: No atrial level shunt detected by color flow Doppler.  LEFT VENTRICLE PLAX 2D LVIDd:         4.82 cm      Diastology LVIDs:         3.04 cm      LV e' medial:    8.59 cm/s LV PW:         0.91 cm      LV E/e' medial:  10.3 LV IVS:        0.91 cm      LV e' lateral:   9.25 cm/s LVOT diam:     2.20 cm      LV E/e' lateral: 9.5 LV SV:         112 LV SV Index:   43 LVOT Area:     3.80 cm  LV Volumes (MOD) LV vol d, MOD A2C: 128.0 ml LV vol d, MOD A4C: 136.0 ml LV vol s, MOD A2C: 43.0 ml LV vol s, MOD A4C:  46.4 ml LV SV MOD A2C:     85.0 ml LV SV MOD A4C:     136.0 ml LV SV MOD BP:      89.5 ml LEFT ATRIUM           Index LA Vol (A4C): 27.5 ml 10.70 ml/m  AORTIC VALVE                   PULMONIC VALVE AV Area (Vmax):    3.75 cm    PV Vmax:       1.05 m/s AV Area (Vmean):   3.69 cm    PV Vmean:      69.500 cm/s AV Area (VTI):     3.99 cm    PV VTI:        0.210 m AV Vmax:           152.00 cm/s PV Peak grad:  4.4 mmHg AV Vmean:          97.300  cm/s PV Mean grad:  2.0 mmHg AV VTI:            0.280 m AV Peak Grad:      9.2 mmHg AV Mean Grad:      4.0 mmHg LVOT Vmax:         150.00 cm/s LVOT Vmean:        94.400 cm/s LVOT VTI:          0.294 m LVOT/AV VTI ratio: 1.05  AORTA Ao Root diam: 2.80 cm MITRAL VALVE MV Area (PHT): 3.21 cm    SHUNTS MV Peak grad:  4.8 mmHg    Systemic VTI:  0.29 m MV Mean grad:  2.0 mmHg    Systemic Diam: 2.20 cm MV Vmax:       1.10 m/s MV Vmean:      69.3 cm/s MV Decel Time: 236 msec MV E velocity: 88.30 cm/s MV A velocity: 84.40 cm/s MV E/A ratio:  1.05 Julien Nordmann MD Electronically signed by Julien Nordmann MD Signature Date/Time: 08/03/2020/4:37:06 PM    Final    ECHOCARDIOGRAM COMPLETE  Result Date: 07/17/2020    ECHOCARDIOGRAM REPORT   Patient Name:   Benjamin Moran Date of Exam: 07/17/2020 Medical Rec #:  [id number]                   Height:       66.0 in Accession #:    [id number]                  Weight:       358.0 lb Date of Birth:  May 05, 1971                    BSA:          2.561 m Patient Age:    49 years                    BP:           135/74 mmHg Patient Gender: M                           HR:           97 bpm. Exam Location:  [facility] Procedure: 2D Echo, Cardiac Doppler and Color Doppler Indications:     Congestive Heart Failure 428.0  History:         Patient has no prior history  of Echocardiogram examinations.                  Risk Factors:Hypertension and Diabetes.  Sonographer:     Cristela Blue RDCS (AE) Referring Phys:  [id number] Delma Freeze Diagnosing Phys: Arnoldo Hooker MD  Sonographer Comments: No apical window, no subcostal window and Technically challenging study due to limited acoustic windows. IMPRESSIONS  1. Left ventricular ejection fraction, by estimation, is 55 to 60%. The left ventricle has normal function. The left ventricle has no regional wall motion abnormalities. Left ventricular diastolic parameters were normal.  2. Right ventricular systolic function is normal. The right  ventricular size is normal.  3. The mitral valve is normal in structure. Trivial mitral valve regurgitation.  4. The aortic valve is normal in structure. Aortic valve regurgitation is not visualized. FINDINGS  Left Ventricle: Left ventricular ejection fraction, by estimation, is 55 to 60%. The left ventricle has normal function. The left ventricle has no regional wall motion abnormalities. The left ventricular internal cavity size was normal in size. There is  no left ventricular hypertrophy. Left ventricular diastolic parameters were normal. Right Ventricle: The right ventricular size is normal. No increase in right ventricular wall thickness. Right ventricular systolic function is normal. Left Atrium: Left atrial size was normal in size. Right Atrium: Right atrial size was normal in size. Pericardium: There is no evidence of pericardial effusion. Mitral Valve: The mitral valve is normal in structure. Trivial mitral valve regurgitation. Tricuspid Valve: The tricuspid valve is normal in structure. Tricuspid valve regurgitation is trivial. Aortic Valve: The aortic valve is normal in structure. Aortic valve regurgitation is not visualized. Pulmonic Valve: The pulmonic valve was normal in structure. Pulmonic valve regurgitation is not visualized. Aorta: The aortic root and ascending aorta are structurally normal, with no evidence of dilitation. IAS/Shunts: No atrial level shunt detected by color flow Doppler.  LEFT VENTRICLE PLAX 2D LVIDd:         4.67 cm LVIDs:         3.23 cm LV PW:         1.25 cm LV IVS:        1.49 cm LVOT diam:     2.00 cm LVOT Area:     3.14 cm  LEFT ATRIUM         Index LA diam:    4.70 cm 1.84 cm/m                        PULMONIC VALVE AORTA                 PV Vmax:        0.74 m/s Ao Root diam: 3.00 cm PV Peak grad:   2.2 mmHg                       RVOT Peak grad: 3 mmHg   SHUNTS Systemic Diam: 2.00 cm Arnoldo Hooker MD Electronically signed by Arnoldo Hooker MD Signature Date/Time:  07/17/2020/12:07:39 PM    Final      Subjective: pt c/o fatigue    Discharge Exam: Vitals:   08/05/20 0807 08/05/20 1152  BP: 133/83 110/61  Pulse: 75 65  Resp: 18 18  Temp: 98.2 F (36.8 C) 99 F (37.2 C)  SpO2: 96% 96%   Vitals:   08/04/20 2021 08/05/20 0442 08/05/20 0807 08/05/20 1152  BP: (!) 102/58 120/76 133/83 110/61  Pulse: 70 68 75 65  Resp: Temp: 98.2 F (36.8 C) 97.8 F (36.6 C) 98.2 F (36.8 C) 99 F (37.2 C)  TempSrc: Oral  Oral Oral  SpO2: 96% 100% 96% 96%  Weight:  (!) 154.7 kg    Height:        General: Pt is alert, awake, not in acute distress. Morbidly obese Cardiovascular:  S1/S2 +, no rubs, no gallops Respiratory: diminished breath sounds b/l  Abdominal: Soft, NT, obese, bowel sounds + Extremities: b/l LE edema, no cyanosis    The results of significant diagnostics from this hospitalization (including imaging, microbiology, ancillary and laboratory) are listed below for reference.     Microbiology: Recent Results (from the past 240 hour(s))  Resp Panel by RT-PCR (Flu A&B, Covid) Nasopharyngeal Swab     Status: None   Collection Time: 08/02/20 12:13 PM   Specimen: Nasopharyngeal Swab; Nasopharyngeal(NP) swabs in vial transport medium  Result Value Ref Range Status   SARS Coronavirus 2 by RT PCR NEGATIVE NEGATIVE Final    Comment: (NOTE) SARS-CoV-2 target nucleic acids are NOT DETECTED.  The SARS-CoV-2 RNA is generally detectable in upper respiratory specimens during the acute phase of infection. The lowest concentration of SARS-CoV-2 viral copies this assay can detect is 138 copies/mL. A negative result does not preclude SARS-Cov-2 infection and should not be used as the sole basis for treatment or other patient management decisions. A negative result may occur with  improper specimen collection/handling, submission of specimen other than nasopharyngeal swab, presence of viral mutation(s) within the areas targeted by this  assay, and inadequate number of viral copies(<138 copies/mL). A negative result must be combined with clinical observations, patient history, and epidemiological information. The expected result is Negative.  Fact Sheet for Patients:  BloggerCourse.com  Fact Sheet for Healthcare Providers:  SeriousBroker.it  This test is no t yet approved or cleared by the [place] FDA and  has been authorized for detection and/or diagnosis of SARS-CoV-2 by FDA under an Emergency Use Authorization (EUA). This EUA will remain  in effect (meaning this test can be used) for the duration of the COVID-19 declaration under Section 564(b)(1) of the Act, 21 U.S.C.section 360bbb-3(b)(1), unless the authorization is terminated  or revoked sooner.       Influenza A by PCR NEGATIVE NEGATIVE Final   Influenza B by PCR NEGATIVE NEGATIVE Final    Comment: (NOTE) The Xpert Xpress SARS-CoV-2/FLU/RSV plus assay is intended as an aid in the diagnosis of influenza from Nasopharyngeal swab specimens and should not be used as a sole basis for treatment. Nasal washings and aspirates are unacceptable for Xpert Xpress SARS-CoV-2/FLU/RSV testing.  Fact Sheet for Patients: BloggerCourse.com  Fact Sheet for Healthcare Providers: SeriousBroker.it  This test is not yet approved or cleared by the [place] FDA and has been authorized for detection and/or diagnosis of SARS-CoV-2 by FDA under an Emergency Use Authorization (EUA). This EUA will remain in effect (meaning this test can be used) for the duration of the COVID-19 declaration under Section 564(b)(1) of the Act, 21 U.S.C. section 360bbb-3(b)(1), unless the authorization is terminated or revoked.  Performed at [facility], [address] [place], [place] [address]   MRSA PCR Screening     Status: None   Collection Time: 08/03/20  9:10 AM    Specimen: Nasopharyngeal  Result Value Ref Range Status   MRSA by PCR NEGATIVE NEGATIVE Final    Comment:        The GeneXpert MRSA Assay (  FDA approved for NASAL specimens only), is one component of a comprehensive MRSA colonization surveillance program. It is not intended to diagnose MRSA infection nor to guide or monitor treatment for MRSA infections. Performed at [facility], [address] [place], [place] [address]      Labs: BNP (last 3 results) Recent Labs    05/27/20 1520 08/02/20 1216  BNP 54.3 263.0*   Basic Metabolic Panel: Recent Labs  Lab 08/02/20 1216 08/03/20 0403 08/04/20 0427 08/05/20 0450  NA 134* 138 137 137  K 5.3* 4.8 4.2 4.1  CL 93* 95* 92* 91*  CO2 32 34* 35* 39*  GLUCOSE 232* 108* 86 95  BUN 22* CREATININE 1.13 0.94 0.82 0.89  CALCIUM 8.2* 8.1* 8.3* 8.7*   Liver Function Tests: Recent Labs  Lab 08/02/20 1216 08/04/20 0427 08/05/20 0450  AST 108* 55* 38  ALT 172* 106* 80*  ALKPHOS 91 69 65  BILITOT 0.6 0.8 0.7  PROT 7.7 6.4* 6.9  ALBUMIN 3.2* 2.6* 2.7*   No results for input(s): LIPASE, AMYLASE in the last 168 hours. Recent Labs  Lab 08/03/20 0109  AMMONIA 21   CBC: Recent Labs  Lab 08/02/20 1216 08/03/20 0403 08/04/20 0427 08/05/20 0450  WBC 13.6* 13.6* 11.7* 9.2  HGB 15.3 14.5 13.9 14.3  HCT 51.2 48.3 45.8 47.3  MCV 88.0 87.5 87.2 86.2  PLT 343 318 308 285   Cardiac Enzymes: No results for input(s): CKTOTAL, CKMB, CKMBINDEX, TROPONINI in the last 168 hours. BNP: Invalid input(s): POCBNP CBG: Recent Labs  Lab 08/04/20 1125 08/04/20 1614 08/04/20 2023 08/05/20 0808 08/05/20 1152  GLUCAP 150* 104* 167* 80 133*   D-Dimer No results for input(s): DDIMER in the last 72 hours. Hgb A1c Recent Labs    08/03/20 0024  HGBA1C 8.0*   Lipid Profile No results for input(s): CHOL, HDL, LDLCALC, TRIG, CHOLHDL, LDLDIRECT in the last 72 hours. Thyroid function studies No results for input(s):  TSH, T4TOTAL, T3FREE, THYROIDAB in the last 72 hours.  Invalid input(s): FREET3 Anemia work up No results for input(s): VITAMINB12, FOLATE, FERRITIN, TIBC, IRON, RETICCTPCT in the last 72 hours. Urinalysis    Component Value Date/Time   COLORURINE Straw 04/22/2012 2050   APPEARANCEUR Clear 04/22/2012 2050   LABSPEC 1.009 04/22/2012 2050   PHURINE 7.0 04/22/2012 2050   GLUCOSEU Negative 04/22/2012 2050   HGBUR 1+ 04/22/2012 2050   BILIRUBINUR Negative 04/22/2012 2050   KETONESUR Negative 04/22/2012 2050   PROTEINUR Negative 04/22/2012 2050   NITRITE Negative 04/22/2012 2050   LEUKOCYTESUR Negative 04/22/2012 2050   Sepsis Labs Invalid input(s): PROCALCITONIN,  WBC,  LACTICIDVEN Microbiology Recent Results (from the past 240 hour(s))  Resp Panel by RT-PCR (Flu A&B, Covid) Nasopharyngeal Swab     Status: None   Collection Time: 08/02/20 12:13 PM   Specimen: Nasopharyngeal Swab; Nasopharyngeal(NP) swabs in vial transport medium  Result Value Ref Range Status   SARS Coronavirus 2 by RT PCR NEGATIVE NEGATIVE Final    Comment: (NOTE) SARS-CoV-2 target nucleic acids are NOT DETECTED.  The SARS-CoV-2 RNA is generally detectable in upper respiratory specimens during the acute phase of infection. The lowest concentration of SARS-CoV-2 viral copies this assay can detect is 138 copies/mL. A negative result does not preclude SARS-Cov-2 infection and should not be used as the sole basis for treatment or other patient management decisions. A negative result may occur with  improper specimen collection/handling, submission of specimen other than nasopharyngeal swab, presence of  viral mutation(s) within the areas targeted by this assay, and inadequate number of viral copies(<138 copies/mL). A negative result must be combined with clinical observations, patient history, and epidemiological information. The expected result is Negative.  Fact Sheet for Patients:   BloggerCourse.com  Fact Sheet for Healthcare Providers:  SeriousBroker.it  This test is no t yet approved or cleared by the [place] FDA and  has been authorized for detection and/or diagnosis of SARS-CoV-2 by FDA under an Emergency Use Authorization (EUA). This EUA will remain  in effect (meaning this test can be used) for the duration of the COVID-19 declaration under Section 564(b)(1) of the Act, 21 U.S.C.section 360bbb-3(b)(1), unless the authorization is terminated  or revoked sooner.       Influenza A by PCR NEGATIVE NEGATIVE Final   Influenza B by PCR NEGATIVE NEGATIVE Final    Comment: (NOTE) The Xpert Xpress SARS-CoV-2/FLU/RSV plus assay is intended as an aid in the diagnosis of influenza from Nasopharyngeal swab specimens and should not be used as a sole basis for treatment. Nasal washings and aspirates are unacceptable for Xpert Xpress SARS-CoV-2/FLU/RSV testing.  Fact Sheet for Patients: BloggerCourse.com  Fact Sheet for Healthcare Providers: SeriousBroker.it  This test is not yet approved or cleared by the [place] FDA and has been authorized for detection and/or diagnosis of SARS-CoV-2 by FDA under an Emergency Use Authorization (EUA). This EUA will remain in effect (meaning this test can be used) for the duration of the COVID-19 declaration under Section 564(b)(1) of the Act, 21 U.S.C. section 360bbb-3(b)(1), unless the authorization is terminated or revoked.  Performed at [facility], [address] [place], [place] [address]   MRSA PCR Screening     Status: None   Collection Time: 08/03/20  9:10 AM   Specimen: Nasopharyngeal  Result Value Ref Range Status   MRSA by PCR NEGATIVE NEGATIVE Final    Comment:        The GeneXpert MRSA Assay (FDA approved for NASAL specimens only), is one component of a comprehensive MRSA  colonization surveillance program. It is not intended to diagnose MRSA infection nor to guide or monitor treatment for MRSA infections. Performed at [facility], [address], [place], [place] [address]      Time coordinating discharge: Over 30 minutes  SIGNED:   Charise Killian, MD  [facility] 08/05/2020, 2:04 PM Pager   If 7PM-7AM, please contact night-coverage

## 2022-05-24 ENCOUNTER — Other Ambulatory Visit: Payer: Self-pay

## 2022-05-24 ENCOUNTER — Emergency Department: Payer: Self-pay

## 2022-05-24 ENCOUNTER — Emergency Department
Admission: EM | Admit: 2022-05-24 | Discharge: 2022-05-24 | Disposition: A | Discharge status: Home / Self Care | Payer: Self-pay | Attending: Emergency Medicine | Admitting: Emergency Medicine

## 2022-05-24 DIAGNOSIS — G8911 Acute pain due to trauma: Secondary | ICD-10-CM | POA: Insufficient documentation

## 2022-05-24 DIAGNOSIS — Y990 Civilian activity done for income or pay: Secondary | ICD-10-CM | POA: Insufficient documentation

## 2022-05-24 DIAGNOSIS — M25562 Pain in left knee: Secondary | ICD-10-CM | POA: Insufficient documentation

## 2022-05-24 DIAGNOSIS — W208XXA Other cause of strike by thrown, projected or falling object, initial encounter: Secondary | ICD-10-CM | POA: Insufficient documentation

## 2022-05-24 DIAGNOSIS — M79672 Pain in left foot: Secondary | ICD-10-CM | POA: Insufficient documentation

## 2022-05-24 MED ORDER — HYDROCODONE-ACETAMINOPHEN 5-325 MG PO TABS: 1.0000 | ORAL_TABLET | Freq: Four times a day (QID) | ORAL | 0 refills | Status: AC | PRN

## 2022-05-24 MED ORDER — ONDANSETRON 4 MG PO TBDP: 4.0000 mg | ORAL_TABLET | Freq: Three times a day (TID) | ORAL | 0 refills | Status: AC | PRN

## 2022-05-24 NOTE — ED Triage Notes (Signed)
Pt arrives with c/o left knee and foot pain after injury. Pt endorse difficulty bearing weight.

## 2022-05-24 NOTE — ED Provider Notes (Signed)
[facility] Provider Note  Patient Contact: 9:26 PM (approximate)   History   Knee Injury and Foot Injury   HPI  Benjamin Moran is a 51 y.o. male presents to the emergency department after a CO2 bottle fell on patient's left knee and left foot.  Patient has been able to bear weight with some discomfort.  No numbness or tingling in the lower extremities.  No abrasions or lacerations.      Physical Exam   Triage Vital Signs: ED Triage Vitals  Enc Vitals Group     BP 05/24/22 1852 (!) 104/54     Pulse Rate 05/24/22 1852 83     Resp 05/24/22 1852 19     Temp 05/24/22 1852 98.5 F (36.9 C)     Temp Source 05/24/22 1852 Oral     SpO2 05/24/22 1852 95 %     Weight 05/24/22 1851 (!) 350 lb (158.8 kg)     Height --      Head Circumference --      Peak Flow --      Pain Score 05/24/22 1851 9     Pain Loc --      Pain Edu? --      Excl. in [place]? --     Most recent vital signs: Vitals:   05/24/22 1852  BP: (!) 104/54  Pulse: 83  Resp: 19  Temp: 98.5 F (36.9 C)  SpO2: 95%     General: Alert and in no acute distress. Eyes:  PERRL. EOMI. Head: No acute traumatic findings ENT:      Nose: No congestion/rhinnorhea.      Mouth/Throat: Mucous membranes are moist.  Neck: No stridor. No cervical spine tenderness to palpation.  Cardiovascular:  Good peripheral perfusion Respiratory: Normal respiratory effort without tachypnea or retractions. Lungs CTAB. Good air entry to the bases with no decreased or absent breath sounds. Gastrointestinal: Bowel sounds 4 quadrants. Soft and nontender to palpation. No guarding or rigidity. No palpable masses. No distention. No CVA tenderness. Musculoskeletal: Full range of motion to all extremities.  Palpable dorsalis pedis pulse bilaterally and symmetrically.  Capillary refill less than 2 seconds bilaterally. Neurologic:  No gross focal neurologic deficits are appreciated.  Skin:   No rash  noted Other:   ED Results / Procedures / Treatments   Labs (all labs ordered are listed, but only abnormal results are displayed) Labs Reviewed - No data to display      RADIOLOGY  I personally viewed and evaluated these images as part of my medical decision making, as well as reviewing the written report by the radiologist.  ED Provider Interpretation: No bony abnormalities on x-ray of the left foot and the left knee.   PROCEDURES:  Critical Care performed: No  Procedures   MEDICATIONS ORDERED IN ED: Medications - No data to display   IMPRESSION / MDM / [place] / ED COURSE  I reviewed the triage vital signs and the nursing notes.                              Assessment and plan Contusion type injury 51 year old male presents to the emergency department after a CO2 bottle fell on his left knee and his left foot while at work.  Vital signs are reassuring at triage.  On exam, patient was alert, active and nontoxic-appearing.  No acute abnormalities were visualized on x-rays of the  left foot and left knee.  Patient was discharged with a short course of Norco for pain and advised to follow-up with primary care as needed.      FINAL CLINICAL IMPRESSION(S) / ED DIAGNOSES   Final diagnoses:  Acute pain of left knee  Pain of left foot     Rx / DC Orders   ED Discharge Orders          Ordered    HYDROcodone-acetaminophen (NORCO) 5-325 MG tablet  Every 6 hours PRN        05/24/22 2125    ondansetron (ZOFRAN-ODT) 4 MG disintegrating tablet  Every 8 hours PRN        05/24/22 2125             Note:  This document was prepared using Dragon voice recognition software and may include unintentional dictation errors.   Karren Cobble 05/24/22 2129    Carrie Mew, MD 05/25/22 0020

## 2022-05-24 NOTE — ED Provider Triage Note (Signed)
  Emergency Medicine Provider Triage Evaluation Note  [facility] , a [place] y.o.male,  was evaluated in triage.  Pt complains of knee and foot pain.  He states that he was at work when he dropped a heavy ball on his left knee and is left foot, particularly towards the toes.  He states that since the incident, he has been unable to bear weight and has had significant numbness/tingling sensation along the lateral aspect of his knee extending into his foot.   Review of Systems  Positive: Knee pain, foot pain, numbness Negative: Denies fever, chest pain, vomiting  Physical Exam   Vitals:   05/24/22 1852  BP: (!) 104/54  Pulse: 83  Resp: 19  Temp: 98.5 F (36.9 C)  SpO2: 95%   Gen:   Awake, no distress   Resp:  Normal effort  MSK:   Moves extremities without difficulty  Other:    Medical Decision Making  Given the patient's initial medical screening exam, the following diagnostic evaluation has been ordered. The patient will be placed in the appropriate treatment space, once one is available, to complete the evaluation and treatment. I have discussed the plan of care with the patient and I have advised the patient that an ED physician or mid-level practitioner will reevaluate their condition after the test results have been received, as the results may give them additional insight into the type of treatment they may need.    Diagnostics: Knee x-ray, foot x-ray  Treatments: none immediately   Teodoro Spray, PA 05/24/22 1853

## 2022-08-10 IMAGING — CR DG CHEST 2V
1 series · 2 of 2 positions shown · non-contrast
Comparison: April 22, 2012

CLINICAL DATA: Shortness of breath

EXAM:
CHEST - 2 VIEW

[Series 1: dg chest 2 view · 0.14mm/px · 2 of 2 slices shown]
[im 1/2]
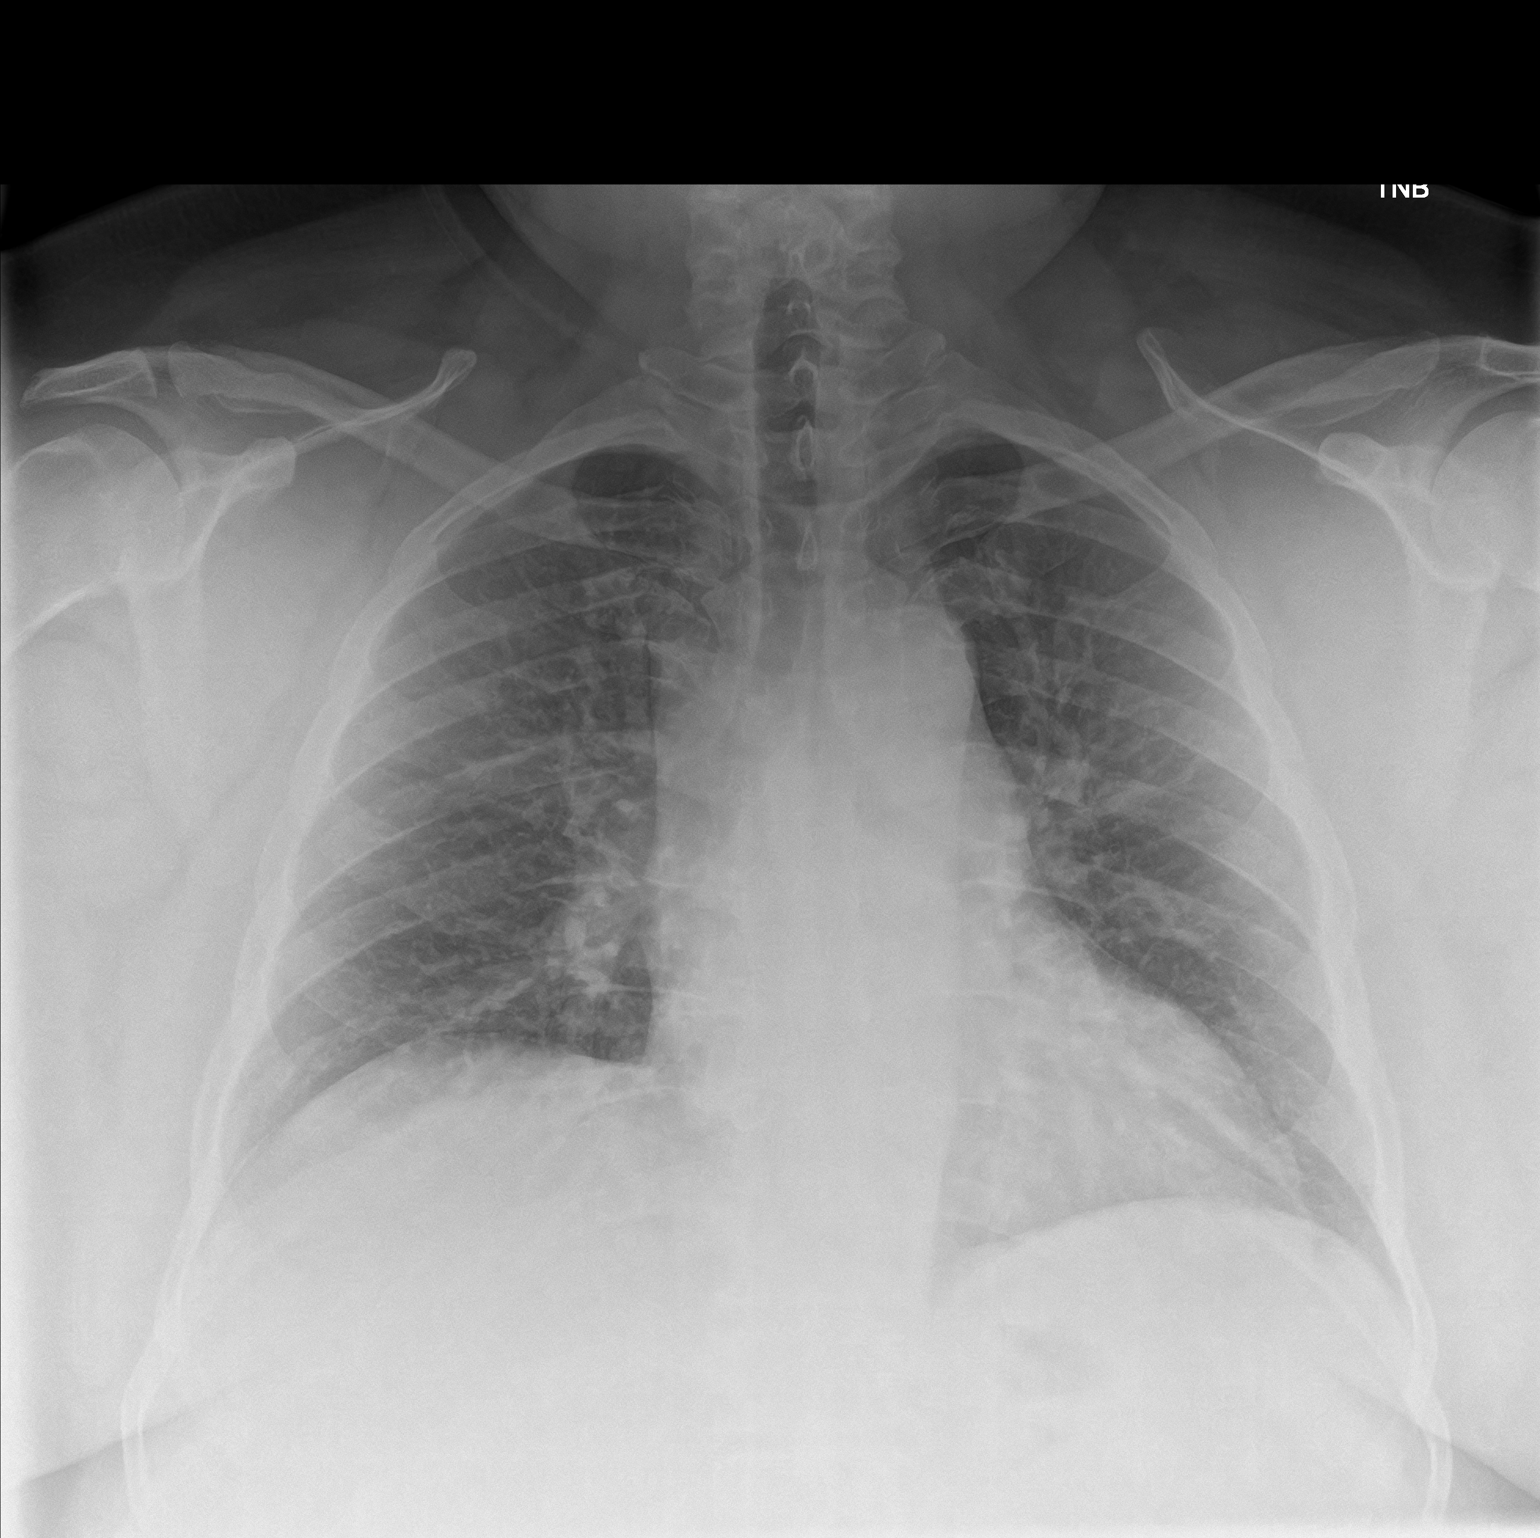
[im 2/2]
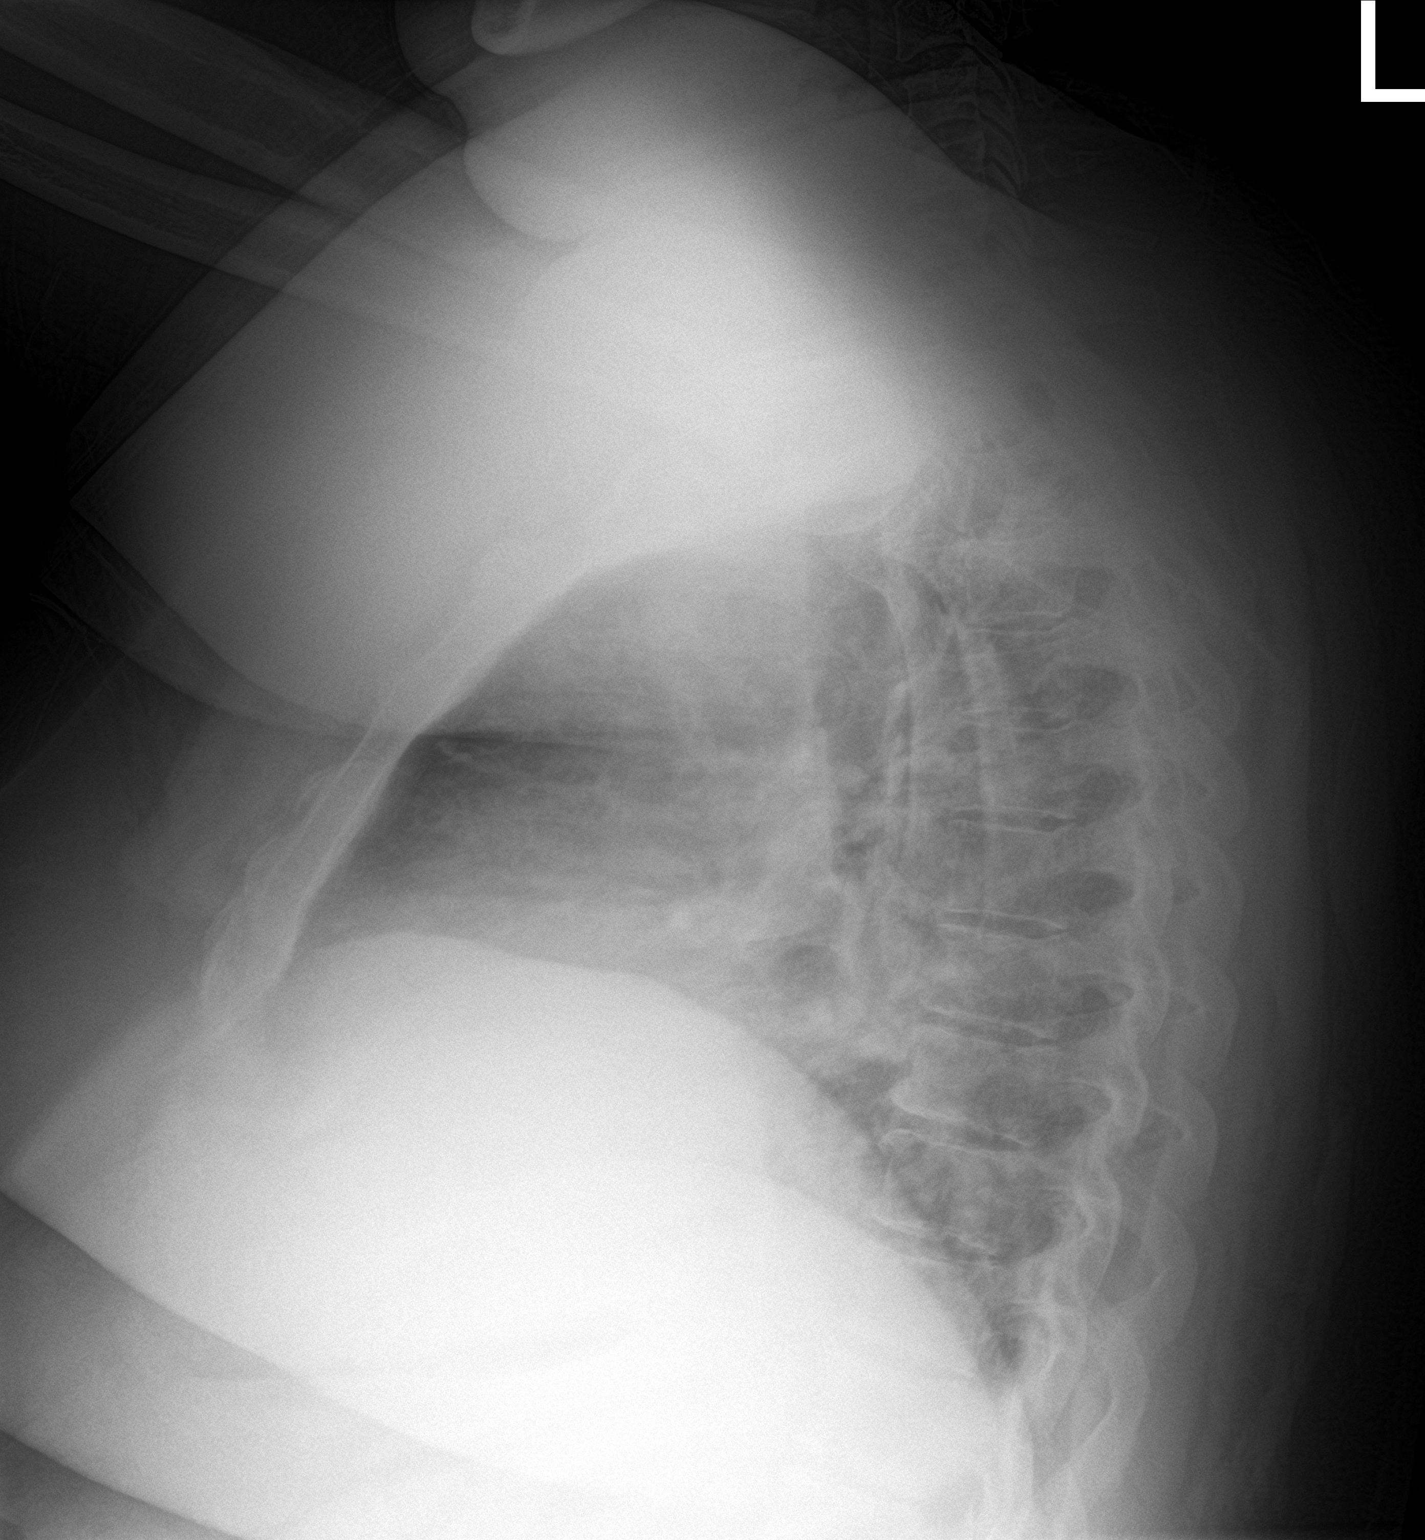

[2 of 2 positions shown; findings below may reference images not displayed]

FINDINGS: The cardiomediastinal silhouette is unchanged and mildly enlarged in
contour.Low lung volumes with bronchovascular crowding. No pleural
effusion. No pneumothorax. No acute pleuroparenchymal abnormality.
Visualized abdomen is unremarkable. Multilevel degenerative changes
of the thoracic spine.
IMPRESSION: Low lung volumes with bronchovascular crowding.

## 2022-10-16 IMAGING — CT CT HEAD W/O CM
3 series · 15 of 47 positions shown, 18 images · non-contrast
Comparison: None.

CLINICAL DATA: Mental status change

EXAM:
CT HEAD WITHOUT CONTRAST
TECHNIQUE: Contiguous axial images were obtained from the base of the skull
through the vertex without intravenous contrast.

[Series 2: head wo · axial · 0.43mm/px · z∈[+350,+480]mm · 9 of 32 slices shown, 12 images]
[im 3/32  brain]
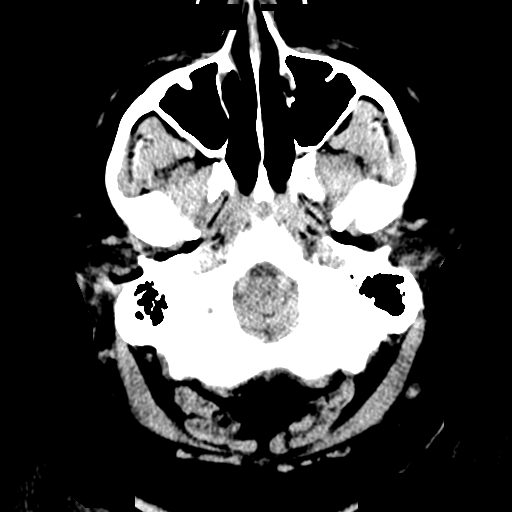
[im 3/32  bone]
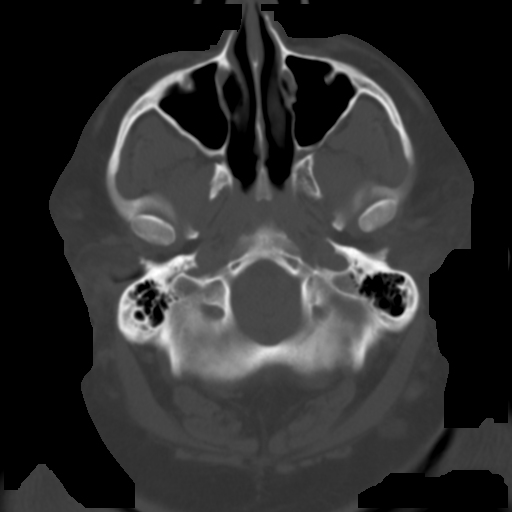
[im 6/32  brain]
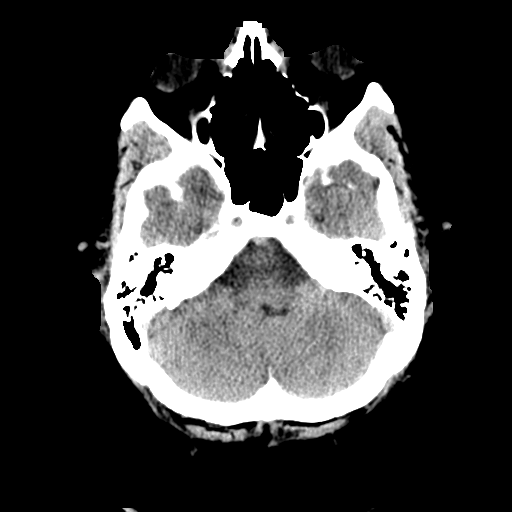
[im 9/32  brain]
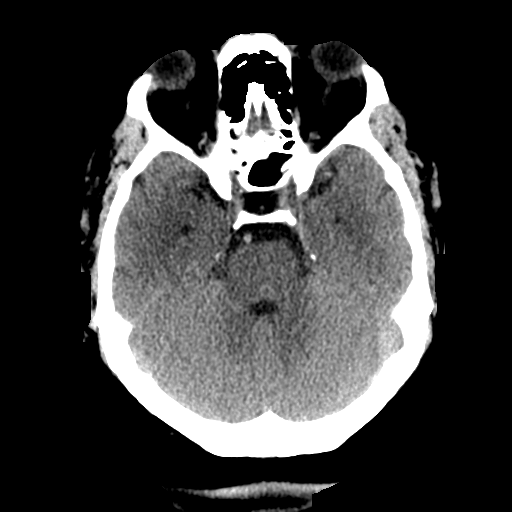
[im 12/32  brain]
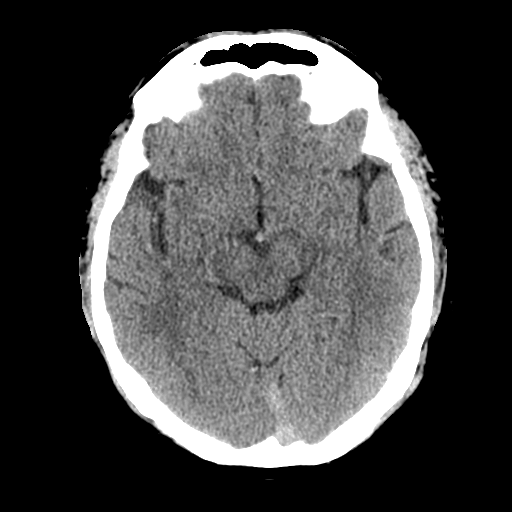
[im 17/32  brain]
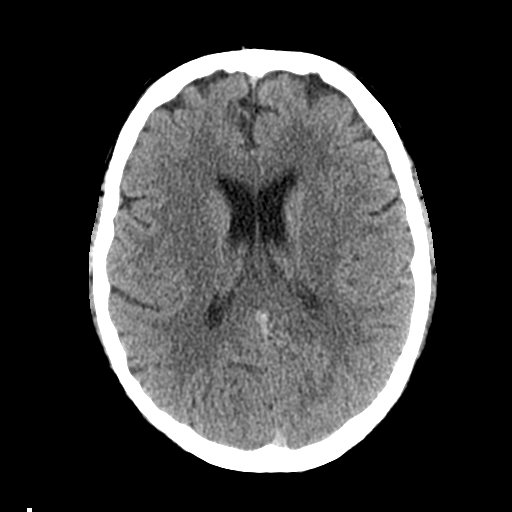
[im 17/32  bone]
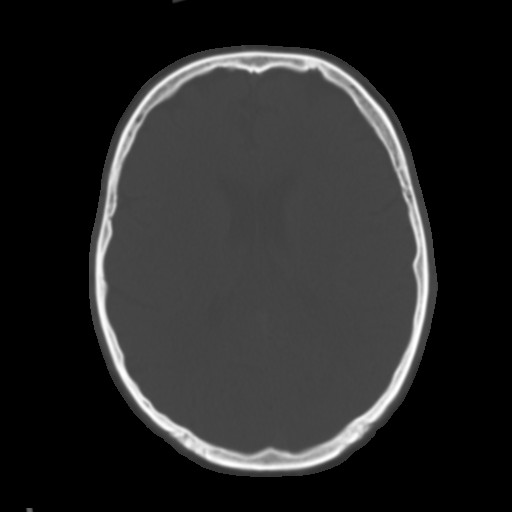
[im 20/32  brain]
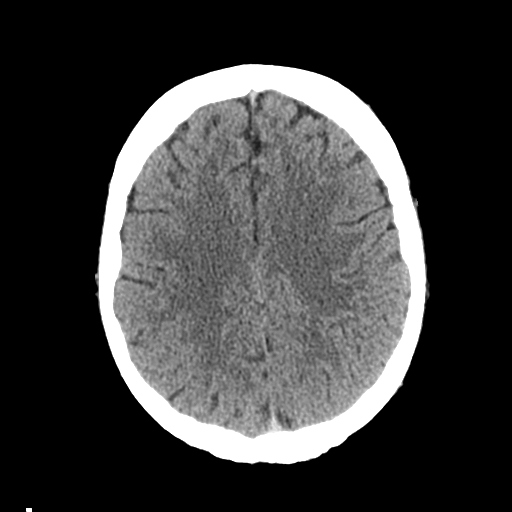
[im 23/32  brain]
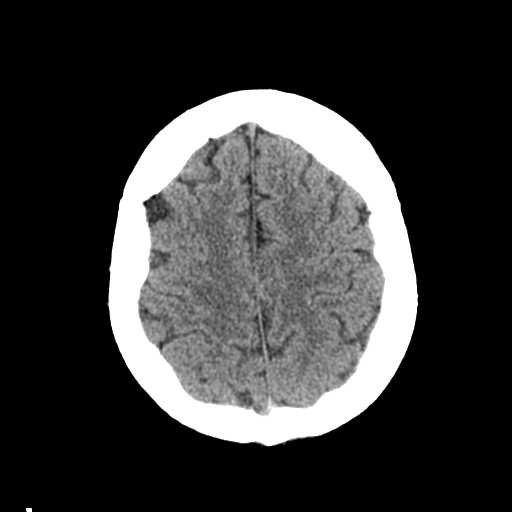
[im 26/32  brain]
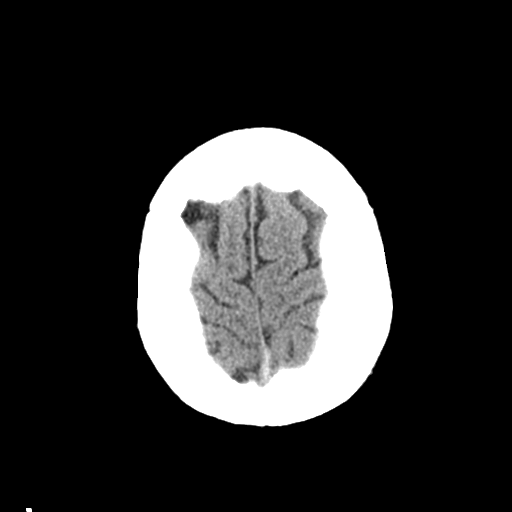
[im 29/32  brain]
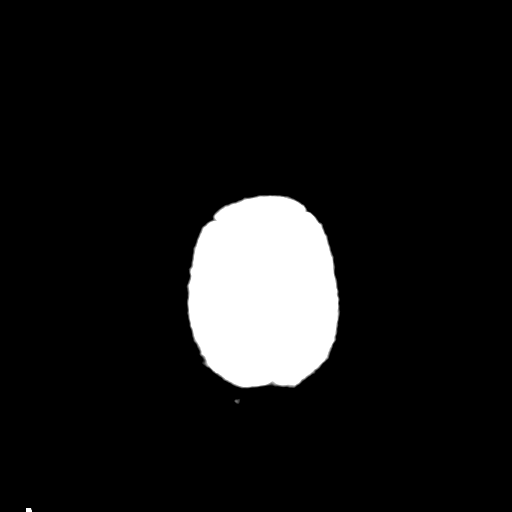
[im 29/32  bone]
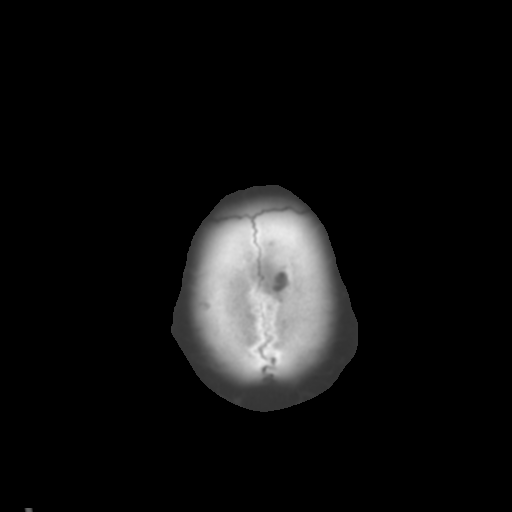

[Series 4: coronal soft tissue · coronal · 0.31mm/px · 3 of 67 slices shown]
[im 23/67  brain]
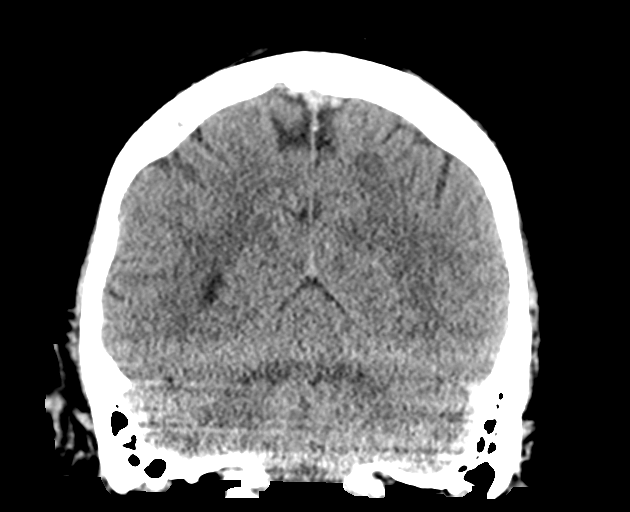
[im 30/67  brain]
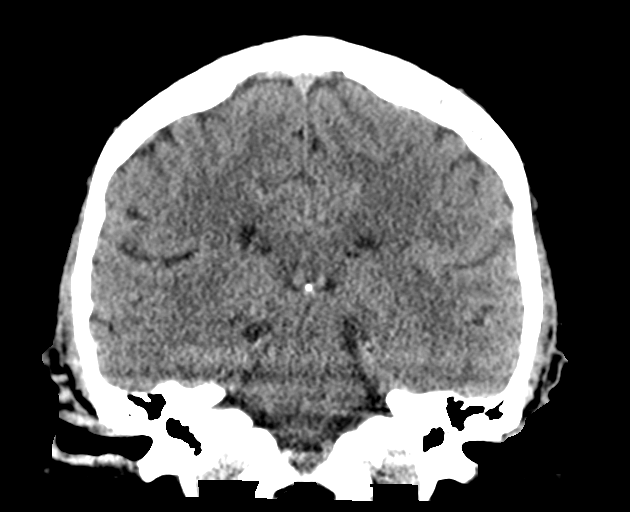
[im 37/67  brain]
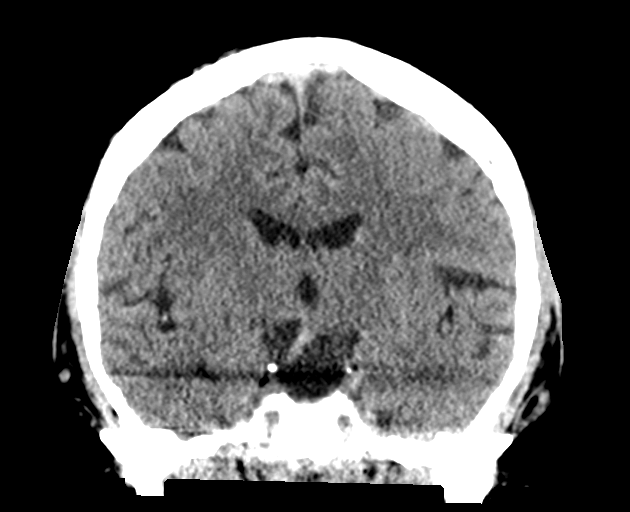

[Series 5: sagittal soft tissue · sagittal · 0.29mm/px · 3 of 56 slices shown]
[im 19/56  brain]
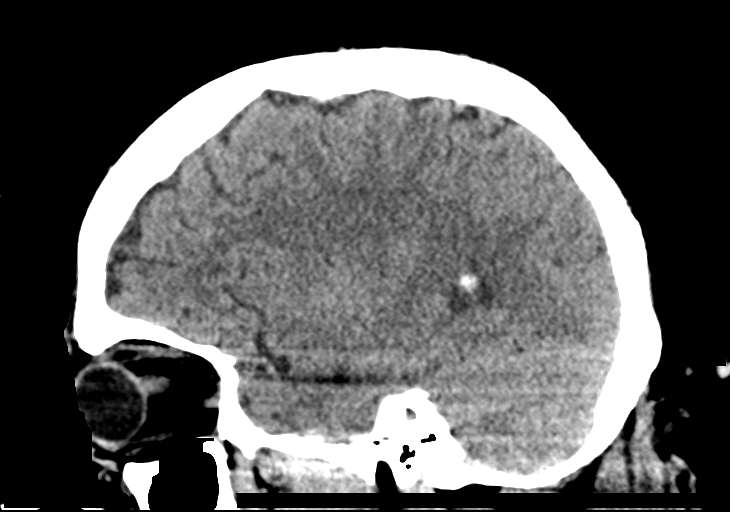
[im 28/56  brain]
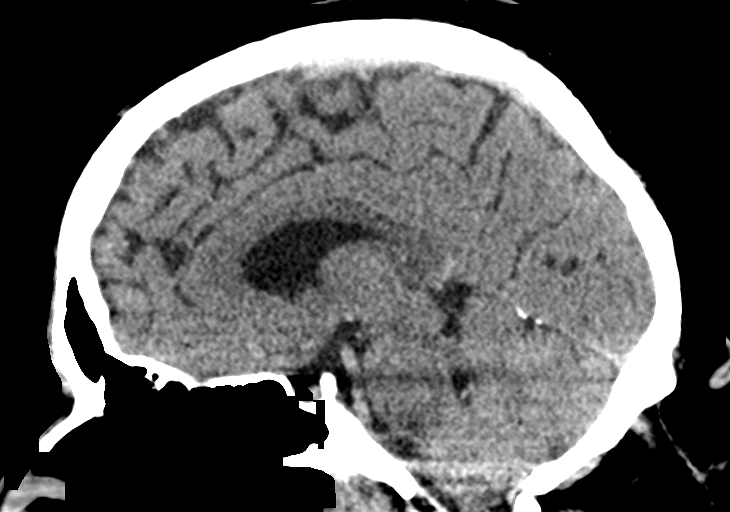
[im 37/56  brain]
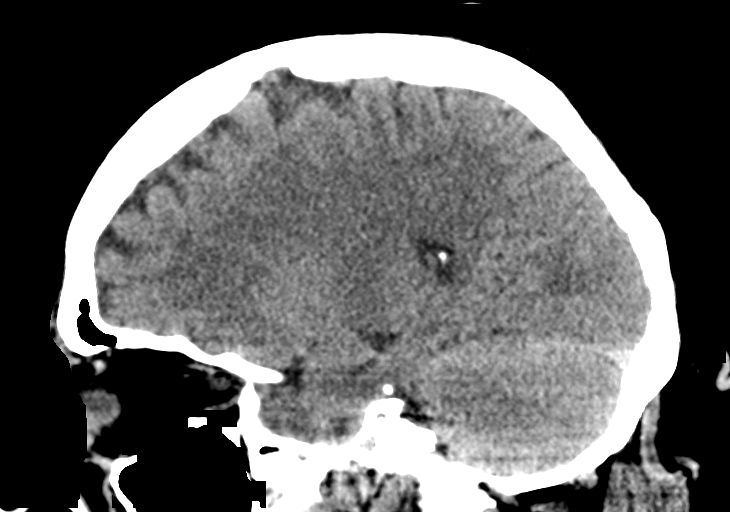

[15 of 47 positions shown; findings below may reference images not displayed]

FINDINGS: Brain: No evidence of acute infarction, hemorrhage, hydrocephalus,
extra-axial collection or mass lesion/mass effect.

Vascular: No hyperdense vessel or unexpected calcification.

Skull: Normal. Negative for fracture or focal lesion.

Sinuses/Orbits: No acute finding.

Other: None.
IMPRESSION: Negative non contrasted CT appearance of the brain.

## 2023-11-13 ENCOUNTER — Ambulatory Visit (LOCAL_COMMUNITY_HEALTH_CENTER): Payer: Self-pay

## 2023-11-13 ENCOUNTER — Other Ambulatory Visit: Payer: Self-pay

## 2023-11-13 DIAGNOSIS — R7612 Nonspecific reaction to cell mediated immunity measurement of gamma interferon antigen response without active tuberculosis: Secondary | ICD-10-CM | POA: Insufficient documentation

## 2023-11-13 NOTE — Progress Notes (Addendum)
 Patient referred by private provider: Civil Surgeon Olene Craven, [phone]. 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 Chest X-Ray normal 11/11/2023. Phone call to patient:  EPI note completed. LTBI v/s Active TB disease education done. LTBI results assigned to Dr. Wyvonnia Lora for review and recommendations.  TB record of screening offered/Patient declined.  Reports assigned to Dr. Wyvonnia Lora for review.  TB clinic nurse will contact patient with further guidance and possible treatment options, if applicable.  Patient states understanding and agrees. Lavinia Sharps, RN

## 2023-11-18 ENCOUNTER — Other Ambulatory Visit: Payer: Self-pay

## 2023-11-18 ENCOUNTER — Telehealth: Payer: Self-pay

## 2023-11-18 DIAGNOSIS — Z227 Latent tuberculosis: Secondary | ICD-10-CM | POA: Insufficient documentation

## 2023-11-18 NOTE — Progress Notes (Signed)
 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   Diagnosis of LTBI and pertinent labs/info: Positive (+) QFT:  10/30/2023  CXR: 11/11/2023 negative  EPI: Completed 11/13/2023   HIV: Offer HIV test.  Syphilis: Offer screening test.  CBC: Consult with provider if symptoms as described.  LFTs: Obtain baseline.   Monthly labs: LFTs; if concern for bleeding, rash, headaches, fatigue or other concerning symptoms consider consult for CBC   If no recent HIV, offer HIV and syphilis at start visit, if declines HIV patient must sign waiver.  ------- Dr. Wyvonnia Lora Note: Additional patient medication information-Refer to Encounter note 11/13/2023 Patient should monitor his BPs at home while undergoing treatment for LTBI and be reminded of the symptoms of poorly controlled blood pressure, both low BP and high BP. The patient may not notice any changes in his BP, but if he does he will need to immediately follow up with his PCP for BP meds adjustments while taking TB meds.    Jennye Moccasin, MD  Tuberculosis treatment orders  All patients are to be monitored per [place] and county TB policies.   __Alberto A. Cruz___ has latent TB. Treat for latent TB per the following:   Rifampin 600 mg daily by mouth x 4 months.   Lavinia Sharps, RN

## 2023-11-18 NOTE — Telephone Encounter (Signed)
 Patient medical record reviewed by Dr. Wyvonnia Lora and recommended LTBI treatment. Phone call to patient recommendations given, questions answered. Patient states understanding and agrees with LTBI tx regimen daily x 4 months. TB clinic appointment scheduled 12/03/2023 at 3:00 pm. Lavinia Sharps, RN

## 2023-12-03 ENCOUNTER — Ambulatory Visit (LOCAL_COMMUNITY_HEALTH_CENTER): Payer: Self-pay

## 2023-12-03 VITALS — Ht 65.0 in | Wt 355.0 lb

## 2023-12-03 DIAGNOSIS — Z227 Latent tuberculosis: Secondary | ICD-10-CM

## 2023-12-03 LAB — HM HIV SCREENING LAB: HM HIV Screening: NEGATIVE

## 2023-12-03 MED ORDER — RIFAMPIN 300 MG PO CAPS: 600.0000 mg | ORAL_CAPSULE | Freq: Every day | ORAL | Status: AC

## 2023-12-03 NOTE — Progress Notes (Signed)
 Initial visit for LTBI treatment: Rifampin #1 Medication written education material given and reviewed with patient.  LTBI treatment consent obtained.  Rifampin 300 mg. #60 dispensed. Appointment for Rifampin #2 scheduled 12/31/2023 and reminder card given. TB clinic nurse contact  phone number given to notify any side effects or concerns regarding her treatment.  Baseline labs, HIV and syphilis test obtained.  Patient states understanding and agrees with plan. Michele Ahle, RN

## 2023-12-04 LAB — CBC WITH DIFFERENTIAL/PLATELET
Basophils Absolute: 0 10*3/uL (ref 0.0–0.2)
Basos: 0 %
EOS (ABSOLUTE): 0.3 10*3/uL (ref 0.0–0.4)
Eos: 2 %
Hematocrit: 48.8 % (ref 37.5–51.0)
Hemoglobin: 16.1 g/dL (ref 13.0–17.7)
Immature Grans (Abs): 0.1 10*3/uL (ref 0.0–0.1)
Immature Granulocytes: 1 %
Lymphocytes Absolute: 2.7 10*3/uL (ref 0.7–3.1)
Lymphs: 23 %
MCH: 27 pg (ref 26.6–33.0)
MCHC: 33 g/dL (ref 31.5–35.7)
MCV: 82 fL (ref 79–97)
Monocytes Absolute: 0.7 10*3/uL (ref 0.1–0.9)
Monocytes: 6 %
Neutrophils Absolute: 7.8 10*3/uL — ABNORMAL HIGH (ref 1.4–7.0)
Neutrophils: 68 %
Platelets: 347 10*3/uL (ref 150–450)
RBC: 5.97 x10E6/uL — ABNORMAL HIGH (ref 4.14–5.80)
RDW: 14.5 % (ref 11.6–15.4)
WBC: 11.5 10*3/uL — ABNORMAL HIGH (ref 3.4–10.8)

## 2023-12-04 LAB — HEPATIC FUNCTION PANEL
ALT: 14 IU/L (ref 0–44)
AST: 14 IU/L (ref 0–40)
Albumin: 3.4 g/dL — ABNORMAL LOW (ref 3.8–4.9)
Alkaline Phosphatase: 74 IU/L (ref 44–121)
Bilirubin Total: 0.4 mg/dL (ref 0.0–1.2)
Bilirubin, Direct: 0.15 mg/dL (ref 0.00–0.40)
Total Protein: 6.9 g/dL (ref 6.0–8.5)

## 2023-12-31 ENCOUNTER — Ambulatory Visit

## 2023-12-31 VITALS — Wt 355.5 lb

## 2023-12-31 DIAGNOSIS — Z227 Latent tuberculosis: Secondary | ICD-10-CM | POA: Diagnosis not present

## 2023-12-31 MED ORDER — RIFAMPIN 300 MG PO CAPS: 600.0000 mg | ORAL_CAPSULE | Freq: Every day | ORAL | Status: AC

## 2023-12-31 NOTE — Progress Notes (Signed)
 In nurse clinic for LTBI / TB Med Management / Rifampin  # 2  / Labs: LFT's Interpreter: [place] interpreter  lang line [phone]  Taking Rifampin  as prescribed. Denies Missing any pills Per pt, approx 2 pills (one day supply) remaining in current bottle.  Advised to complete this bottle before starting bottle that will be dispensed today.   No Changes in  Meds No New meds  New Health problems Reports rash on back with some itching that started one week ago and comes and goes, not everyday Per patient, resolves after showering. States he has had this in the past.   Phone consult Dr Ellena Gurney who advises patient to gently cleanse back area each day and may use 1% hydrocortisone cream as needed for itching. If rash/itching persist, advises patient to f-u with PCP. OK to continue Rifampin .   RN discussed provider recommendation with patient and he is in agreement and states his wife can help him with cleansing area and applying cream.   Alcohol: denies. Advised to abstain from alcohol as Rifampin  can damage the liver.   The patient was dispensed Rifampin  300 mg #60 (Bottle # 2) today per order by Dr Ellena Gurney. I provided counseling today regarding the medication. We discussed the medication, the side effects and when to call clinic. Patient given the opportunity to ask questions. Questions answered.    TB contact card given and advised to contact with questions, concerns, side effects.  Rifampin  information sheet offered but has one at home.   Next TB med appt scheduled for 01/28/2024 at 3 pm. Appt card given.  Epic message sent to [facility] supervisor Linnell Richardson Rudd, Charity fundraiser) to place patient on epic schedule. RN walked patient to lab for LFT's.  Verdon Ferrante, RN

## 2024-01-01 LAB — HEPATIC FUNCTION PANEL
ALT: 21 IU/L (ref 0–44)
AST: 16 IU/L (ref 0–40)
Albumin: 3.6 g/dL — ABNORMAL LOW (ref 3.8–4.9)
Alkaline Phosphatase: 83 IU/L (ref 44–121)
Bilirubin Total: 0.3 mg/dL (ref 0.0–1.2)
Bilirubin, Direct: 0.12 mg/dL (ref 0.00–0.40)
Total Protein: 6.8 g/dL (ref 6.0–8.5)

## 2024-01-01 NOTE — Progress Notes (Signed)
 MD Attestation for TB RN: I agree with the care provided to this patient and the plan for follow up and treatment.  Alessandra Bevels. Wyvonnia Lora, MD

## 2024-01-02 ENCOUNTER — Ambulatory Visit: Payer: Self-pay | Admitting: Surgery

## 2024-01-28 ENCOUNTER — Ambulatory Visit (LOCAL_COMMUNITY_HEALTH_CENTER)

## 2024-01-28 VITALS — Wt 361.5 lb

## 2024-01-28 DIAGNOSIS — Z227 Latent tuberculosis: Secondary | ICD-10-CM

## 2024-01-28 MED ORDER — RIFAMPIN 300 MG PO CAPS
600.0000 mg | ORAL_CAPSULE | Freq: Every day | ORAL | Status: AC
Start: 2024-01-28 — End: 2024-02-27

## 2024-01-28 NOTE — Progress Notes (Signed)
 In nurse clinic for LTBI / TB Med Management / Rifampin  #3 / Labs: LFTs LL Interpreter, Devra Fontana [phone], helped during this visit.   Taking Rifampin  as prescribed: Yes Missing any pills: No 2 pills remaining in current bottle.  Advised to complete this bottle before starting bottle that will be dispensed today.   Changes in  Meds: none New meds: none  New Health problems: Patient reports leg swelling and feels he is retaining fluid in legs x 15 days. Patient reports taking medication that is supposed to help but not sure if it is working. Consulted with Dr Ellena Gurney, who orders a CMP instead of monthly LFT. Gives okay for patient to continue taking medication and we will call if any abnormal results. Patient agrees and verbalizes understanding.   Alcohol: denies. Advised to abstain from alcohol as Rifampin  can damage the liver.   The patient was dispensed Rifampin  300mg  #60 today per order by Ellena Gurney, MD. I provided counseling today regarding the medication. We discussed the medication, the side effects and when to call clinic. Patient given the opportunity to ask questions. Questions answered.    TB contact card given and advised to contact with questions, concerns, side effects.   Next TB med appt scheduled for July 9th at [facility]. Appt card given.   RN walked patient to lab for CMP.   Clare Critchley, RN

## 2024-01-29 ENCOUNTER — Ambulatory Visit: Payer: Self-pay | Admitting: Surgery

## 2024-01-29 LAB — COMPREHENSIVE METABOLIC PANEL WITH GFR
ALT: 15 IU/L (ref 0–44)
AST: 13 IU/L (ref 0–40)
Albumin: 3.7 g/dL — ABNORMAL LOW (ref 3.8–4.9)
Alkaline Phosphatase: 90 IU/L (ref 44–121)
BUN/Creatinine Ratio: 11 (ref 9–20)
BUN: 13 mg/dL (ref 6–24)
Bilirubin Total: 0.3 mg/dL (ref 0.0–1.2)
CO2: 27 mmol/L (ref 20–29)
Calcium: 8.5 mg/dL — ABNORMAL LOW (ref 8.7–10.2)
Chloride: 97 mmol/L (ref 96–106)
Creatinine, Ser: 1.14 mg/dL (ref 0.76–1.27)
Globulin, Total: 3.3 g/dL (ref 1.5–4.5)
Glucose: 76 mg/dL (ref 70–99)
Potassium: 4.7 mmol/L (ref 3.5–5.2)
Sodium: 140 mmol/L (ref 134–144)
Total Protein: 7 g/dL (ref 6.0–8.5)
eGFR: 76 mL/min/{1.73_m2} (ref >59)

## 2024-01-30 NOTE — Telephone Encounter (Signed)
 Lab results from 01/28/2024 reviewed by Dr. Alyn Babe. Phone call to patient, MD recommendations given questions answered. Advised to continue LTBI treatment as directed, return to TB clinic on 02/2024 for medication refill. Follow-up with PCP for BP check and referral for renal evaluation. Patient states understanding and agrees with plan. Michele Ahle, RN

## 2024-02-05 ENCOUNTER — Emergency Department

## 2024-02-05 ENCOUNTER — Emergency Department
Admission: EM | Admit: 2024-02-05 | Discharge: 2024-02-06 | Disposition: A | Discharge status: Home / Self Care | Attending: Emergency Medicine | Admitting: Emergency Medicine

## 2024-02-05 ENCOUNTER — Other Ambulatory Visit: Payer: Self-pay

## 2024-02-05 DIAGNOSIS — R600 Localized edema: Secondary | ICD-10-CM | POA: Insufficient documentation

## 2024-02-05 DIAGNOSIS — R2240 Localized swelling, mass and lump, unspecified lower limb: Secondary | ICD-10-CM | POA: Diagnosis present

## 2024-02-05 LAB — BASIC METABOLIC PANEL WITH GFR
Anion gap: 11 (ref 5–15)
BUN: 13 mg/dL (ref 6–20)
CO2: 30 mmol/L (ref 22–32)
Calcium: 8.5 mg/dL — ABNORMAL LOW (ref 8.9–10.3)
Chloride: 98 mmol/L (ref 98–111)
Creatinine, Ser: 1.03 mg/dL (ref 0.61–1.24)
GFR, Estimated: >60 mL/min (ref >60)
Glucose, Bld: 97 mg/dL (ref 70–99)
Potassium: 4.2 mmol/L (ref 3.5–5.1)
Sodium: 139 mmol/L (ref 135–145)

## 2024-02-05 LAB — HEPATIC FUNCTION PANEL
ALT: 24 U/L (ref 0–44)
AST: 22 U/L (ref 15–41)
Albumin: 3.2 g/dL — ABNORMAL LOW (ref 3.5–5.0)
Alkaline Phosphatase: 63 U/L (ref 38–126)
Bilirubin, Direct: 0.1 mg/dL (ref 0.0–0.2)
Indirect Bilirubin: 0.6 mg/dL (ref 0.3–0.9)
Total Bilirubin: 0.7 mg/dL (ref 0.0–1.2)
Total Protein: 7.9 g/dL (ref 6.5–8.1)

## 2024-02-05 LAB — CBC
HCT: 51.5 % (ref 39.0–52.0)
Hemoglobin: 16.1 g/dL (ref 13.0–17.0)
MCH: 27.6 pg (ref 26.0–34.0)
MCHC: 31.3 g/dL (ref 30.0–36.0)
MCV: 88.2 fL (ref 80.0–100.0)
Platelets: 328 10*3/uL (ref 150–400)
RBC: 5.84 MIL/uL — ABNORMAL HIGH (ref 4.22–5.81)
RDW: 15.4 % (ref 11.5–15.5)
WBC: 10.5 10*3/uL (ref 4.0–10.5)
nRBC: 0 % (ref 0.0–0.2)

## 2024-02-05 LAB — BRAIN NATRIURETIC PEPTIDE: B Natriuretic Peptide: 49.1 pg/mL (ref 0.0–100.0)

## 2024-02-05 LAB — TROPONIN I (HIGH SENSITIVITY): Troponin I (High Sensitivity): 12 ng/L (ref <18)

## 2024-02-05 MED ORDER — FUROSEMIDE 10 MG/ML IJ SOLN
60.0000 mg | Freq: Once | INTRAMUSCULAR | Status: AC
  Administered 2024-02-05: 60 mg via INTRAVENOUS
  Filled 2024-02-05: qty 8

## 2024-02-05 NOTE — ED Notes (Signed)
 Placed patient on 2L [place] due to hypoxia 86%. Patient denies shortness of breath.

## 2024-02-05 NOTE — ED Triage Notes (Signed)
 Pt states swelling to BLE, states ongoing for 5 days and worsening. Pt states he takes Lasix  and needs more because it isn't working. Pt endorses some SOB.

## 2024-02-05 NOTE — ED Provider Notes (Signed)
 [facility] Provider Note    Event Date/Time   First MD Initiated Contact with Patient 02/05/24 1806     (approximate)   History   Leg Swelling   HPI  [facility] Miklos Bidinger is a 53 y.o. male who presents to the emergency department today because of concerns for leg swelling.  Patient states that his legs have been getting progressively swollen for roughly 1 week.  He denies any significant pain with the swelling.  He says that he has been taking his medication but the swelling has continued.  This has not been associated with any shortness of breath or chest pain.  He denies any fevers.     Physical Exam   Triage Vital Signs: ED Triage Vitals [02/05/24 1559]  Encounter Vitals Group     BP (!) 146/100     Girls Systolic BP Percentile      Girls Diastolic BP Percentile      Boys Systolic BP Percentile      Boys Diastolic BP Percentile      Pulse Rate (!) 51     Resp 20     Temp 98.9 F (37.2 C)     Temp Source Oral     SpO2 98 %     Weight (!) 359 lb 5.6 oz (163 kg)     Height 5' 5 (1.651 m)     Head Circumference      Peak Flow      Pain Score 0     Pain Loc      Pain Education      Exclude from Growth Chart     Most recent vital signs: Vitals:   02/05/24 1559  BP: (!) 146/100  Pulse: (!) 51  Resp: 20  Temp: 98.9 F (37.2 C)  SpO2: 98%   General: Awake, alert, oriented. CV:  Good peripheral perfusion. Regular rate and rhythm. Resp:  Normal effort. Lungs clear. Abd:  No distention.  Other:  Bilateral lower extremity edema. No erythema. No tenderness.    ED Results / Procedures / Treatments   Labs (all labs ordered are listed, but only abnormal results are displayed) Labs Reviewed  CBC - Abnormal; Notable for the following components:      Result Value   RBC 5.84 (*)    All other components within normal limits  BASIC METABOLIC PANEL WITH GFR - Abnormal; Notable for the following components:   Calcium  8.5 (*)     All other components within normal limits  HEPATIC FUNCTION PANEL - Abnormal; Notable for the following components:   Albumin 3.2 (*)    All other components within normal limits  BRAIN NATRIURETIC PEPTIDE  TROPONIN I (HIGH SENSITIVITY)  TROPONIN I (HIGH SENSITIVITY)     EKG  None   RADIOLOGY I independently interpreted and visualized the CXR. My interpretation: No pneumonia Radiology interpretation:  IMPRESSION:  Chronic cardiomegaly with central pulmonary vascular congestion and  bilateral interstitial prominence, which could reflect  bronchovascular crowding or interstitial edema.   US  venous lower bilateral  IMPRESSION:  1. No evidence of DVT.    PROCEDURES:  Critical Care performed: No  MEDICATIONS ORDERED IN ED: Medications - No data to display   IMPRESSION / MDM / ASSESSMENT AND PLAN / ED COURSE  I reviewed the triage vital signs and the nursing notes.  Differential diagnosis includes, but is not limited to, kidney failure, liver failure, heart failure, dvt  Patient's presentation is most consistent with acute presentation with potential threat to life or bodily function.   Patient presents to the emergency department today because of concerns for bilateral lower extremity swelling.  The patient denied any shortness of breath to myself.  On exam he does have bilateral swelling although no erythema or tenderness.  Workup without findings concerning for kidney failure, liver failure.  BNP was normal.  Chest x-ray did raise concern for possible pulmonary edema however patient without any crackles or wheezing on my exam.  Nursing staff noted that patient had an episode of hypoxia.  He was given IV Lasix  and did have significant diuresis afterwards.  He was then able to ambulate around the emergency department off oxygen without any hypoxia.  At this time I think it is reasonable for patient be discharged.  Will give patient referral to  heart failure clinic.      FINAL CLINICAL IMPRESSION(S) / ED DIAGNOSES   Final diagnoses:  Peripheral edema     Note:  This document was prepared using Dragon voice recognition software and may include unintentional dictation errors.    Marylynn Soho, MD 02/05/24 [phone]

## 2024-02-24 NOTE — Progress Notes (Unsigned)
 Advanced Heart Failure Clinic Note   Referring Physician: EDP PCP: [facility], [facility] Cardiologist: None   Chief Complaint: shortness of breath  Medical interpreter available via telephone for the entire visit   HPI:  Benjamin Moran ia a 53 y/o male with a history of DM, HTN, OSA, latent TB, obesity and chronic heart failure (from 2021)  Admitted 08/02/20 due to SOB, fatigue and chest pain and became lethargic. When he was awake he was acting like a robot, walking around aimlessly, confused and incoherent. She brought him here because he was attempting to drive but kept falling asleep. His O2 sats were initially found to be 30%, but came up to mid 80s on 5L.SABRAHe was found to be hypercapneic and therefore was placed on bipap for acute hypoxic respiratory failure. He is only responsive to painful stimuli and gives no history. Found to have acute respiratory failure likely secondary to acute on chronic CHF exacerbation. Echo 08/03/20: EF 60-65%, G1DD, normal RV. Pt was treated w/ IV lasix , coreg , spironolactone , & lisinopril  as well as monitoring I/Os. Pt initially required BiPAP but pt was able to be weaned off prior to d/c. Unfortunately, pt could not be weaned from supplemental oxygen so pt was d/c home w/ 2L [place]. CT brain shows no acute intracranial findings. Elevated troponins thought to be due to demand ischemia.   Was in the ED 02/05/24 with worsening pedal edema over the last week. US  negative for DVT. BNP normal. CXR did raise concern for possible pulmonary edema. Episode of hypoxia. IV lasix  given with significant diuresis. Ambulated without hypoxia and released.   He presents today, with his daughter, for his initial HF visit with a chief complaint of shortness of breath. Has associated  fatigue, pedal edema (no change), snoring.Sleeping well on stomach. Denies chest pain, palpitations, abdominal distention or dizziness.   Denies tobacco, rare alcohol use. Not weighing  himself but does have scales at home.    Review of Systems: [y] = yes, [ ]  = no   General: Weight gain [ ] ; Weight loss [ ] ; Anorexia [ ] ; Fatigue [ y]; Fever [ ] ; Chills [ ] ; Weakness [ ]   Cardiac: Chest pain/pressure [ ] ; Resting SOB [ ] ; Exertional SOB Davis.Dad ]; Orthopnea [ ] ; Pedal Edema Davis.Dad ]; Palpitations [ ] ; Syncope [ ] ; Presyncope [ ] ; Paroxysmal nocturnal dyspnea[ ]   Pulmonary: Cough [ ] ; Wheezing[ ] ; Hemoptysis[ ] ; Sputum [ ] ; Snoring [ ]   GI: Vomiting[ ] ; Dysphagia[ ] ; Melena[ ] ; Hematochezia [ ] ; Heartburn[ ] ; Abdominal pain [ ] ; Constipation [ ] ; Diarrhea [ ] ; BRBPR [ ]   GU: Hematuria[ ] ; Dysuria [ ] ; Nocturia[ ]   Vascular: Pain in legs with walking [ ] ; Pain in feet with lying flat [ ] ; Non-healing sores [ ] ; Stroke [ ] ; TIA [ ] ; Slurred speech [ ] ;  Neuro: Headaches[ ] ; Vertigo[ ] ; Seizures[ ] ; Paresthesias[ ] ;Blurred vision [ ] ; Diplopia [ ] ; Vision changes [ ]   Ortho/Skin: Arthritis [ ] ; Joint pain [ ] ; Muscle pain [ ] ; Joint swelling [ ] ; Back Pain [ ] ; Rash [ ]   Psych: Depression[ ] ; Anxiety[ ]   Heme: Bleeding problems [ ] ; Clotting disorders [ ] ; Anemia [ ]   Endocrine: Diabetes [ y]; Thyroid dysfunction[ ]    Past Medical History:  Diagnosis Date   Borderline diabetic    Diabetes mellitus without complication (HCC)    Hypertension    Renal disorder     Current Outpatient Medications  Medication Sig Dispense Refill  acetaminophen  (TYLENOL ) 325 MG tablet Take 650 mg by mouth every 6 (six) hours as needed.     aspirin  81 MG chewable tablet Chew by mouth daily.     atorvastatin  (LIPITOR) 10 MG tablet Take 10 mg by mouth daily.     carvedilol  (COREG ) 3.125 MG tablet Take 3.125 mg by mouth 2 (two) times daily with a meal.     furosemide  (LASIX ) 40 MG tablet Take 1 tablet (40 mg total) by mouth 2 (two) times daily. 30 tablet 1   liraglutide (VICTOZA) 18 MG/3ML SOPN Inject 18 mg into the skin. (Patient not taking: Reported on 12/31/2023)     lisinopril  (ZESTRIL ) 20 MG tablet  Take 20 mg by mouth daily.     metFORMIN (GLUCOPHAGE) 500 MG tablet Take 500 mg by mouth 2 (two) times daily with a meal.     rifampin  (RIFADIN ) 300 MG capsule Take 2 capsules (600 mg total) by mouth daily.     Semaglutide, 1 MG/DOSE, (OZEMPIC, 1 MG/DOSE,) 4 MG/3ML SOPN Inject into the skin.     spironolactone  (ALDACTONE ) 25 MG tablet Take 25 mg by mouth daily.     No current facility-administered medications for this visit.    Allergies  Allergen Reactions   Penicillins Hives    Other reaction(s): Unknown      Social History   Socioeconomic History   Marital status: Married    Spouse name: Not on file   Number of children: Not on file   Years of education: Not on file   Highest education level: Not on file  Occupational History   Not on file  Tobacco Use   Smoking status: Former    Current packs/day: 0.00    Types: Cigarettes    Quit date: 08/19/2006    Years since quitting: 17.5   Smokeless tobacco: Never  Vaping Use   Vaping status: Never Used  Substance and Sexual Activity   Alcohol use: Not Currently    Alcohol/week: 1.0 standard drink of alcohol    Types: 1 Cans of beer per week    Comment: denies alcohol since starting rifampin    Drug use: No   Sexual activity: Not on file  Other Topics Concern   Not on file  Social History Narrative   Not on file   Social Drivers of Health   Financial Resource Strain: Not on file  Food Insecurity: Not on file  Transportation Needs: Not on file  Physical Activity: Not on file  Stress: Not on file  Social Connections: Not on file  Intimate Partner Violence: Not on file      Family History  Problem Relation Age of Onset   Parkinson's disease Mother    Kidney Stones Father    Heart attack Maternal Uncle    Breast cancer Cousin    Vitals:   02/25/24 1055  BP: 135/83  Pulse: 78  SpO2: 95%  Weight: (!) 356 lb 6.4 oz (161.7 kg)   Wt Readings from Last 3 Encounters:  02/25/24 (!) 355 lb 8 oz (161.3 kg)  02/25/24  (!) 356 lb 6.4 oz (161.7 kg)  02/05/24 (!) 359 lb 5.6 oz (163 kg)   Lab Results  Component Value Date   CREATININE 1.00 02/25/2024   CREATININE 1.03 02/05/2024   CREATININE 1.14 01/28/2024    PHYSICAL EXAM:  General: Well appearing obese male. No resp difficulty HEENT: normal Neck: supple, no JVD although difficult to assess due to body habitus Cor: Regular rhythm, rate. No rubs,  gallops or murmurs Lungs: clear Abdomen: soft, nontender, nondistended. Extremities: no cyanosis, clubbing, rash, 2+ pitting edema bilateral lower legs with L>R Neuro: alert & oriented X 3. Moves all 4 extremities w/o difficulty. Affect pleasant   ECG:not done   ASSESSMENT & PLAN:  1: Chronic heart failure with preserved ejection fraction- - suspect due to untreated sleep apnea - NYHA class II - euvolemic - instructed to weigh daily and parameters to call about discussed - Echo 08/03/20: EF 60-65%, G1DD, normal RV. - updated echo to be scheduled - continue carvedilol  3.125mg  BID - begin jardiance  10mg  daily/ BMET next visit - continue lisinopril  10mg  daily - continue torsemide 20mg  BID - BNP 02/05/24 was 49.1/ BNP today   2: HTN- - BP 135/83 - BMET 02/05/24 reviewed: sodium 138, potassium 4.2, creatinine 1.03 & GFR >60 - repeat BMP today  3: DM- - continue metformin 500mg  BID - continue ozempic 1mg  weekly - managed by PCP  4: Snoring- - daughter & patient both endorse that he snores - scored 7 on STOPBANG - in lab sleep study ordered  5: Latent TB- - [facility] - continues with rifampin  600mg  daily   Return in 1 month, sooner if needed.   Ellouise DELENA Class, FNP 02/24/24

## 2024-02-25 ENCOUNTER — Encounter: Payer: Self-pay | Admitting: Family

## 2024-02-25 ENCOUNTER — Ambulatory Visit: Payer: Self-pay | Admitting: Family

## 2024-02-25 ENCOUNTER — Encounter

## 2024-02-25 ENCOUNTER — Ambulatory Visit (LOCAL_COMMUNITY_HEALTH_CENTER)

## 2024-02-25 ENCOUNTER — Ambulatory Visit: Admitting: Family

## 2024-02-25 ENCOUNTER — Other Ambulatory Visit
Admission: RE | Admit: 2024-02-25 | Discharge: 2024-02-25 | Disposition: A | Discharge status: Home / Self Care | Source: Ambulatory Visit | Attending: Family | Admitting: Family

## 2024-02-25 VITALS — BP 135/83 | HR 78 | Wt 356.4 lb

## 2024-02-25 VITALS — Wt 355.5 lb

## 2024-02-25 DIAGNOSIS — E119 Type 2 diabetes mellitus without complications: Secondary | ICD-10-CM | POA: Diagnosis not present

## 2024-02-25 DIAGNOSIS — Z227 Latent tuberculosis: Secondary | ICD-10-CM

## 2024-02-25 DIAGNOSIS — I5032 Chronic diastolic (congestive) heart failure: Secondary | ICD-10-CM

## 2024-02-25 DIAGNOSIS — R600 Localized edema: Secondary | ICD-10-CM | POA: Diagnosis present

## 2024-02-25 DIAGNOSIS — R0683 Snoring: Secondary | ICD-10-CM | POA: Diagnosis not present

## 2024-02-25 DIAGNOSIS — I10 Essential (primary) hypertension: Secondary | ICD-10-CM | POA: Diagnosis not present

## 2024-02-25 LAB — BASIC METABOLIC PANEL WITH GFR
Anion gap: 8 (ref 5–15)
BUN: 14 mg/dL (ref 6–20)
CO2: 31 mmol/L (ref 22–32)
Calcium: 8.3 mg/dL — ABNORMAL LOW (ref 8.9–10.3)
Chloride: 99 mmol/L (ref 98–111)
Creatinine, Ser: 1 mg/dL (ref 0.61–1.24)
GFR, Estimated: >60 mL/min (ref >60)
Glucose, Bld: 113 mg/dL — ABNORMAL HIGH (ref 70–99)
Potassium: 4.5 mmol/L (ref 3.5–5.1)
Sodium: 138 mmol/L (ref 135–145)

## 2024-02-25 LAB — BRAIN NATRIURETIC PEPTIDE: B Natriuretic Peptide: 26.2 pg/mL (ref 0.0–100.0)

## 2024-02-25 MED ORDER — RIFAMPIN 300 MG PO CAPS: 600.0000 mg | ORAL_CAPSULE | Freq: Every day | ORAL | Status: AC

## 2024-02-25 MED ORDER — EMPAGLIFLOZIN 10 MG PO TABS: 10.0000 mg | ORAL_TABLET | Freq: Every day | ORAL | 11 refills | Status: AC

## 2024-02-25 NOTE — Patient Instructions (Addendum)
 Begin weighing daily and call for an overnight weight gain of 3 pounds or more or a weekly weight gain of more than 5 pounds.  Medication Changes:  START Jardiance  10mg  (1 tab) daily  Lab Work:  Go over to the [facility]. Go pass the gift shop and have your blood work completed.  We will only call you if the results are abnormal or if the provider would like to make medication changes.   Testing/Procedures:  Your physician has requested that you have an echocardiogram. Echocardiography is a painless test that uses sound waves to create images of your heart. It provides your doctor with information about the size and shape of your heart and how well your heart's chambers and valves are working. This procedure takes approximately one hour. There are no restrictions for this procedure. Please do NOT wear cologne, perfume, aftershave, or lotions (deodorant is allowed). Please arrive 15 minutes prior to your appointment time.  Please note: We ask at that you not bring children with you during ultrasound (echo/ vascular) testing. Due to room size and safety concerns, children are not allowed in the ultrasound rooms during exams. Our front office staff cannot provide observation of children in our lobby area while testing is being conducted. An adult accompanying a patient to their appointment will only be allowed in the ultrasound room at the discretion of the ultrasound technician under special circumstances. We apologize for any inconvenience.   Follow-Up in: Please follow up with the Advanced Heart Failure Clinic in 1 month with Ellouise Class, FNP  At the Advanced Heart Failure Clinic, you and your health needs are our priority. We have a designated team specialized in the treatment of Heart Failure. This Care Team includes your primary Heart Failure Specialized Cardiologist (physician), Advanced Practice Providers (APPs- Physician Assistants and Nurse Practitioners), and Pharmacist who all  work together to provide you with the care you need, when you need it.   You may see any of the following providers on your designated Care Team at your next follow up:  Dr. Toribio Fuel Dr. Ezra Shuck Dr. Ria Commander Dr. Odis Brownie Ellouise Class, FNP Jaun Bash, RPH-CPP  Please be sure to bring in all your medications bottles to every appointment.   Need to Contact Us :  If you have any questions or concerns before your next appointment please send us  a message through [place] or call our office at [phone].    TO LEAVE A MESSAGE FOR THE NURSE SELECT OPTION 2, PLEASE LEAVE A MESSAGE INCLUDING: YOUR NAME DATE OF BIRTH CALL BACK NUMBER REASON FOR CALL**this is important as we prioritize the call backs  YOU WILL RECEIVE A CALL BACK THE SAME DAY AS LONG AS YOU CALL BEFORE 4:00 PM

## 2024-02-25 NOTE — Progress Notes (Signed)
 In nurse clinic for LTBI / TB Med Completion/ Rifampin  # 4  / Labs: LFT's Interpreter pacific interpreter id [phone] started visit and I Rapheal assisted for majority of visit.   Taking Rifampin  as prescribed,  Denies Missing any pills Per patient, approx 6 pills (3 day supply) remaining in current bottle.  Advised to complete this bottle before starting bottle that will be dispensed today.    New meds: yes, see med list. Jardiance  and torsemide added recently  New Health problems: patient explains he has had swelling and redness  ankles, lower legs. Was seen in ER last week for cardiac insufficiency, heart failure.  Saw cardiologist today and has f-u in one month.   Consult Dr JINNY Leghorn and informed of patient status and new meds. OK to continue Rifampin  and continue with LFT's today. Advises patient to notify cardiologist and PCP if any concerning issues, symptoms. Advised to notify [facility] if any concerns, side effects with Rifampin .   Alcohol: denies. Advised to abstain from alcohol as Rifampin  can damage the liver.   The patient was dispensed Rifampin  300 mg #60 (bottle #4) today. I provided counseling today regarding the medication. We discussed the medication, the side effects and when to call clinic. Patient given the opportunity to ask questions. Questions answered.    TB contact card given and advised to contact with questions, concerns, side effects.   TB completion card given.  TB patient completion letter given (English and [place]) and reviewed.  PCP completion letter faxed successfully to [facility].6+2  RN walked patient to lab for LFT's. Jaylianna Tatlock, RN

## 2024-02-25 NOTE — Progress Notes (Signed)
 Patient Name:         DOB:       Height:     Weight:  Office Name:         Referring Provider:  Today's Date:  Date:   STOP BANG RISK ASSESSMENT S (snore) Have you been told that you snore?     YES   T (tired) Are you often tired, fatigued, or sleepy during the day?   YES  O (obstruction) Do you stop breathing, choke, or gasp during sleep? NO   P (pressure) Do you have or are you being treated for high blood pressure? YES   B (BMI) Is your body index greater than 35 kg/m? YES   A (age) Are you 55 years old or older? YES   N (neck) Do you have a neck circumference greater than 16 inches?   YES   G (gender) Are you a male? YES   TOTAL STOP/BANG "YES" ANSWERS 7                                                                       For Office Use Only              Procedure Order Form    YES to 3+ Stop Bang questions OR two clinical symptoms - patient qualifies for WatchPAT (CPT 95800)             Clinical Notes: Will consult Sleep Specialist and refer for management of therapy due to patient increased risk of Sleep Apnea. Ordering a sleep study due to the following two clinical symptoms: Excessive daytime sleepiness G47.10 / History of high blood pressure R03.0    I understand that I am proceeding with a home sleep apnea test as ordered by my treating physician. I understand that untreated sleep apnea is a serious cardiovascular risk factor and it is my responsibility to perform the test and seek management for sleep apnea. I will be contacted with the results and be managed for sleep apnea by a local sleep physician. I will be receiving equipment and further instructions from [facility]. I shall promptly ship back the equipment via the included mailing label. I understand my insurance will be billed for the test and as the patient I am responsible for any insurance related out-of-pocket costs incurred. I have been provided with written instructions and can call for  additional video or telephonic instruction, with 24-hour availability of qualified personnel to answer any questions: Patient Help Desk [phone].  Patient Signature ______________________________________________________   Date______________________ Patient Telemedicine Verbal Consent

## 2024-02-26 ENCOUNTER — Ambulatory Visit: Payer: Self-pay | Admitting: Surgery

## 2024-02-26 LAB — HEPATIC FUNCTION PANEL
ALT: 15 IU/L (ref 0–44)
AST: 17 IU/L (ref 0–40)
Albumin: 3.7 g/dL — ABNORMAL LOW (ref 3.8–4.9)
Alkaline Phosphatase: 88 IU/L (ref 44–121)
Bilirubin Total: 0.2 mg/dL (ref 0.0–1.2)
Bilirubin, Direct: 0.09 mg/dL (ref 0.00–0.40)
Total Protein: 7 g/dL (ref 6.0–8.5)

## 2024-02-26 NOTE — Progress Notes (Signed)
 MD Attestation for TB RN: I agree with the care provided to this patient and the plan for follow up and treatment.  Alessandra Bevels. Wyvonnia Lora, MD

## 2024-03-29 ENCOUNTER — Encounter: Payer: Self-pay | Admitting: Family

## 2024-03-29 ENCOUNTER — Other Ambulatory Visit
Admission: RE | Admit: 2024-03-29 | Discharge: 2024-03-29 | Disposition: A | Discharge status: Home / Self Care | Source: Ambulatory Visit | Attending: Family | Admitting: Family

## 2024-03-29 ENCOUNTER — Ambulatory Visit (HOSPITAL_BASED_OUTPATIENT_CLINIC_OR_DEPARTMENT_OTHER): Admitting: Family

## 2024-03-29 ENCOUNTER — Ambulatory Visit: Payer: Self-pay | Admitting: Family

## 2024-03-29 VITALS — BP 137/67 | HR 82 | Wt 366.0 lb

## 2024-03-29 DIAGNOSIS — R0683 Snoring: Secondary | ICD-10-CM

## 2024-03-29 DIAGNOSIS — I5032 Chronic diastolic (congestive) heart failure: Secondary | ICD-10-CM | POA: Insufficient documentation

## 2024-03-29 DIAGNOSIS — I10 Essential (primary) hypertension: Secondary | ICD-10-CM

## 2024-03-29 DIAGNOSIS — Z227 Latent tuberculosis: Secondary | ICD-10-CM

## 2024-03-29 DIAGNOSIS — E119 Type 2 diabetes mellitus without complications: Secondary | ICD-10-CM

## 2024-03-29 LAB — BASIC METABOLIC PANEL WITH GFR
Anion gap: 9 (ref 5–15)
BUN: 16 mg/dL (ref 6–20)
CO2: 30 mmol/L (ref 22–32)
Calcium: 8.4 mg/dL — ABNORMAL LOW (ref 8.9–10.3)
Chloride: 95 mmol/L — ABNORMAL LOW (ref 98–111)
Creatinine, Ser: 0.93 mg/dL (ref 0.61–1.24)
GFR, Estimated: >60 mL/min (ref >60)
Glucose, Bld: 165 mg/dL — ABNORMAL HIGH (ref 70–99)
Potassium: 4.1 mmol/L (ref 3.5–5.1)
Sodium: 134 mmol/L — ABNORMAL LOW (ref 135–145)

## 2024-03-29 MED ORDER — SPIRONOLACTONE 25 MG PO TABS: 12.5000 mg | ORAL_TABLET | Freq: Every day | ORAL | 3 refills | Status: AC

## 2024-03-29 MED ORDER — TORSEMIDE 20 MG PO TABS: 60.0000 mg | ORAL_TABLET | Freq: Every day | ORAL | 3 refills | Status: DC

## 2024-03-29 MED ORDER — TORSEMIDE 20 MG PO TABS: 60.0000 mg | ORAL_TABLET | Freq: Every day | ORAL | 3 refills | Status: AC

## 2024-03-29 MED ORDER — SPIRONOLACTONE 25 MG PO TABS
12.5000 mg | ORAL_TABLET | Freq: Every day | ORAL | 3 refills | Status: DC
Start: 2024-03-29 — End: 2024-03-29

## 2024-03-29 NOTE — Progress Notes (Signed)
 Advanced Heart Failure Clinic Note   Referring Physician: EDP PCP: [facility], [facility] Cardiologist: None   Chief Complaint: shortness of breath  Medical interpreter [name]) available in person for the entire visit   HPI:  Benjamin Moran ia a 53 y/o male with a history of DM, HTN, OSA, latent TB, obesity and chronic heart failure (from 2021)  Admitted 08/02/20 due to SOB, fatigue and chest pain and became lethargic. When he was awake he was acting like a robot, walking around aimlessly, confused and incoherent. She brought him here because he was attempting to drive but kept falling asleep. His O2 sats were initially found to be 30%, but came up to mid 80s on 5L.SABRAHe was found to be hypercapneic and therefore was placed on bipap for acute hypoxic respiratory failure. He is only responsive to painful stimuli and gives no history. Found to have acute respiratory failure likely secondary to acute on chronic CHF exacerbation. Echo 08/03/20: EF 60-65%, G1DD, normal RV. Pt was treated w/ IV lasix , coreg , spironolactone , & lisinopril  as well as monitoring I/Os. Pt initially required BiPAP but pt was able to be weaned off prior to d/c. Unfortunately, pt could not be weaned from supplemental oxygen so pt was d/c home w/ 2L [place]. CT brain shows no acute intracranial findings. Elevated troponins thought to be due to demand ischemia.   Was in the ED 02/05/24 with worsening pedal edema over the last week. US  negative for DVT. BNP normal. CXR did raise concern for possible pulmonary edema. Episode of hypoxia. IV lasix  given with significant diuresis. Ambulated without hypoxia and released.   Seen in [facility] 07/25 where jardiance  10mg  daily was started.   He presents today, with his daughter, for a HF follow-up visit with a chief complaint of shortness of breath. Has associated fatigue, worsening pedal edema, weight gain along with this. Denies chest pain, palpitations, dizziness. Not weighing daily  as his scales only goes up to 350 pounds.   He says that he hasn't heard anything from the sleep lab regarding the sleep study. He is also interested in bariatric surgery and asks if we can make a referral for further discussion.   ROS: All systems negative except what is listed in HPI, PMH and Problem List   Past Medical History:  Diagnosis Date   Borderline diabetic    Diabetes mellitus without complication (HCC)    Hypertension    Renal disorder     Current Outpatient Medications  Medication Sig Dispense Refill   acetaminophen  (TYLENOL ) 325 MG tablet Take 650 mg by mouth every 6 (six) hours as needed.     aspirin  81 MG chewable tablet Chew by mouth daily.     atorvastatin  (LIPITOR) 10 MG tablet Take 10 mg by mouth daily.     carvedilol  (COREG ) 3.125 MG tablet Take 3.125 mg by mouth 2 (two) times daily with a meal.     empagliflozin  (JARDIANCE ) 10 MG TABS tablet Take 1 tablet (10 mg total) by mouth daily before breakfast. 30 tablet 11   lisinopril  (ZESTRIL ) 20 MG tablet Take 20 mg by mouth daily. (Patient taking differently: Take 10 mg by mouth daily.)     metFORMIN (GLUCOPHAGE) 500 MG tablet Take 500 mg by mouth 2 (two) times daily with a meal.     Semaglutide, 1 MG/DOSE, (OZEMPIC, 1 MG/DOSE,) 4 MG/3ML SOPN Inject into the skin.     torsemide  (DEMADEX ) 20 MG tablet Take 20 mg by mouth 2 (two) times daily.  No current facility-administered medications for this visit.    Allergies  Allergen Reactions   Penicillins Hives    Other reaction(s): Unknown      Social History   Socioeconomic History   Marital status: Married    Spouse name: Not on file   Number of children: Not on file   Years of education: Not on file   Highest education level: Not on file  Occupational History   Not on file  Tobacco Use   Smoking status: Former    Current packs/day: 0.00    Types: Cigarettes    Quit date: 08/19/2006    Years since quitting: 17.6   Smokeless tobacco: Never  Vaping  Use   Vaping status: Never Used  Substance and Sexual Activity   Alcohol use: Not Currently    Alcohol/week: 1.0 standard drink of alcohol    Types: 1 Cans of beer per week    Comment: denies alcohol since starting rifampin    Drug use: No   Sexual activity: Not on file  Other Topics Concern   Not on file  Social History Narrative   Not on file   Social Drivers of Health   Financial Resource Strain: Not on file  Food Insecurity: Not on file  Transportation Needs: Not on file  Physical Activity: Not on file  Stress: Not on file  Social Connections: Not on file  Intimate Partner Violence: Not on file      Family History  Problem Relation Age of Onset   Parkinson's disease Mother    Kidney Stones Father    Heart attack Maternal Uncle    Breast cancer Cousin    Vitals:   03/29/24 1326  BP: 137/67  Pulse: 82  SpO2: (!) 84%  Weight: (!) 366 lb (166 kg)   Wt Readings from Last 3 Encounters:  03/29/24 (!) 366 lb (166 kg)  02/25/24 (!) 355 lb 8 oz (161.3 kg)  02/25/24 (!) 356 lb 6.4 oz (161.7 kg)   Lab Results  Component Value Date   CREATININE 1.00 02/25/2024   CREATININE 1.03 02/05/2024   CREATININE 1.14 01/28/2024    PHYSICAL EXAM:  General: Well appearing, obese male.  Cor: Difficult to assess JVD due to neck size. Regular rhythm, rate.  Lungs: clear Abdomen: soft, nontender, nondistended. Extremities: 1+ pitting edema bilateral lower legs Neuro:. Affect pleasant   ECG:not done   ASSESSMENT & PLAN:  1: Chronic heart failure with preserved ejection fraction- - suspect due to untreated sleep apnea - NYHA class II - euvolemic - weight up 10 pounds from last visit here 1 month ago - scales provided today that go up to 400 pounds - Echo 08/03/20: EF 60-65%, G1DD, normal RV. - updated echo to be scheduled - continue carvedilol  3.125mg  BID - continue jardiance  10mg  daily - continue lisinopril  20mg  daily - begin spironolactone  12.5mg  daily. Pill cutter  provided to patient today - Increase torsemide  to 60mg  daily - BMET today, 1 week and at next OV - BNP 02/05/24 was 49.1  2: HTN- - BP 137/67 - [facility] PCP at [facility] - BMET 02/25/24 reviewed: sodium 138, potassium 4.5, creatinine 1.00 & GFR >60 - BMET today  3: DM- - continue metformin 500mg  BID - continue ozempic 1mg  weekly - managed by PCP  4: Snoring- - daughter & patient both endorse that he snores - scored 7 on STOPBANG - order for sleep study sent in today  5: Latent TB- - [facility] with [facility] - continues with rifampin  600mg  daily  6: Morbid obesity- - BMI 60.91 kg/m2 - referral placed today to discuss bariatric surgery - continue ozempic. Consider titration   Return in 1 month, sooner if needed.   Ellouise DELENA Class, FNP 03/29/24

## 2024-03-29 NOTE — Patient Instructions (Signed)
 Medication Changes:  CHANGE Torsemide  to 60mg  ( 3 tabs) daily  START Spironolactone  12.5mg  (1/2 tab) daily  Lab Work:  Go over to the [facility]. Go pass the gift shop and have your blood work completed TODAY AND IN ONE WEEK.  We will only call you if the results are abnormal or if the provider would like to make medication changes.  No news is good news.   Special Instructions // Education:  Do the following things EVERYDAY: Weigh yourself in the morning before breakfast. Write it down and keep it in a log. Take your medicines as prescribed Eat low salt foods--Limit salt (sodium) to 2000 mg per day.  Stay as active as you can everyday Limit all fluids for the day to less than 2 liters   Follow-Up in: Please follow up with the Advanced Heart Failure Clinic in 1 month with Ellouise Class, FNP.   Thank you for choosing [facility] Advanced [facility] Failure [facility].    At the Advanced Heart Failure Clinic, you and your health needs are our priority. We have a designated team specialized in the treatment of Heart Failure. This Care Team includes your primary Heart Failure Specialized Cardiologist (physician), Advanced Practice Providers (APPs- Physician Assistants and Nurse Practitioners), and Pharmacist who all work together to provide you with the care you need, when you need it.   You may see any of the following providers on your designated Care Team at your next follow up:  Dr. Toribio Fuel Dr. Ezra Shuck Dr. Ria Commander Dr. Morene Brownie Ellouise Class, FNP Jaun Bash, RPH-CPP  Please be sure to bring in all your medications bottles to every appointment.   Need to Contact Us :  If you have any questions or concerns before your next appointment please send us  a message through [place] or call our office at [phone].    TO LEAVE A MESSAGE FOR THE NURSE SELECT OPTION 2, PLEASE LEAVE A MESSAGE INCLUDING: YOUR NAME DATE OF BIRTH CALL BACK  NUMBER REASON FOR CALL**this is important as we prioritize the call backs  YOU WILL RECEIVE A CALL BACK THE SAME DAY AS LONG AS YOU CALL BEFORE 4:00 PM

## 2024-04-05 ENCOUNTER — Other Ambulatory Visit
Admission: RE | Admit: 2024-04-05 | Discharge: 2024-04-05 | Disposition: A | Discharge status: Home / Self Care | Source: Ambulatory Visit | Attending: Family | Admitting: Family

## 2024-04-05 DIAGNOSIS — I5032 Chronic diastolic (congestive) heart failure: Secondary | ICD-10-CM | POA: Insufficient documentation

## 2024-04-05 LAB — BASIC METABOLIC PANEL WITH GFR
Anion gap: 13 (ref 5–15)
BUN: 20 mg/dL (ref 6–20)
CO2: 30 mmol/L (ref 22–32)
Calcium: 8.5 mg/dL — ABNORMAL LOW (ref 8.9–10.3)
Chloride: 97 mmol/L — ABNORMAL LOW (ref 98–111)
Creatinine, Ser: 1.25 mg/dL — ABNORMAL HIGH (ref 0.61–1.24)
GFR, Estimated: >60 mL/min (ref >60)
Glucose, Bld: 97 mg/dL (ref 70–99)
Potassium: 4.4 mmol/L (ref 3.5–5.1)
Sodium: 140 mmol/L (ref 135–145)

## 2024-04-20 ENCOUNTER — Ambulatory Visit: Attending: Otolaryngology

## 2024-04-20 DIAGNOSIS — G4733 Obstructive sleep apnea (adult) (pediatric): Secondary | ICD-10-CM | POA: Diagnosis not present

## 2024-04-20 DIAGNOSIS — R0683 Snoring: Secondary | ICD-10-CM | POA: Diagnosis present

## 2024-04-20 DIAGNOSIS — G478 Other sleep disorders: Secondary | ICD-10-CM | POA: Diagnosis not present

## 2024-04-20 DIAGNOSIS — I493 Ventricular premature depolarization: Secondary | ICD-10-CM | POA: Insufficient documentation

## 2024-04-20 DIAGNOSIS — G4736 Sleep related hypoventilation in conditions classified elsewhere: Secondary | ICD-10-CM | POA: Insufficient documentation

## 2024-04-29 NOTE — Progress Notes (Unsigned)
 Advanced Heart Failure Clinic Note   Referring Physician: EDP PCP: [facility] Cardiologist: None   Chief Complaint:  Medical interpreter [name]) available in person for the entire visit   HPI:  Benjamin Moran ia a 53 y/o male with a history of DM, HTN, OSA, latent TB, obesity and chronic heart failure (from 2021)  Admitted 08/02/20 due to SOB, fatigue and chest pain and became lethargic. When he was awake he was acting like a robot, walking around aimlessly, confused and incoherent. She brought him here because he was attempting to drive but kept falling asleep. His O2 sats were initially found to be 30%, but came up to mid 80s on 5L.SABRAHe was found to be hypercapneic and therefore was placed on bipap for acute hypoxic respiratory failure. He is only responsive to painful stimuli and gives no history. Found to have acute respiratory failure likely secondary to acute on chronic CHF exacerbation. Echo 08/03/20: EF 60-65%, G1DD, normal RV. Pt was treated w/ IV lasix , coreg , spironolactone , & lisinopril  as well as monitoring I/Os. Pt initially required BiPAP but pt was able to be weaned off prior to d/c. Unfortunately, pt could not be weaned from supplemental oxygen so pt was d/c home w/ 2L [place]. CT brain shows no acute intracranial findings. Elevated troponins thought to be due to demand ischemia.   Was in the ED 02/05/24 with worsening pedal edema over the last week. US  negative for DVT. BNP normal. CXR did raise concern for possible pulmonary edema. Episode of hypoxia. IV lasix  given with significant diuresis. Ambulated without hypoxia and released.   Seen in [facility] 07/25 where jardiance  10mg  daily was started.   Seen in [facility] 08/25 where spironolactone  was started & torsemide  was increased to 60mg  daily.   He presents today, with his daughter, for a HF follow-up visit with a chief complaint of     ROS: All systems negative except what is listed in HPI, PMH and Problem  List   Past Medical History:  Diagnosis Date   Borderline diabetic    Diabetes mellitus without complication (HCC)    Hypertension    Renal disorder     Current Outpatient Medications  Medication Sig Dispense Refill   acetaminophen  (TYLENOL ) 325 MG tablet Take 650 mg by mouth every 6 (six) hours as needed.     aspirin  81 MG chewable tablet Chew by mouth daily.     atorvastatin  (LIPITOR) 10 MG tablet Take 10 mg by mouth daily.     carvedilol  (COREG ) 3.125 MG tablet Take 3.125 mg by mouth 2 (two) times daily with a meal.     empagliflozin  (JARDIANCE ) 10 MG TABS tablet Take 1 tablet (10 mg total) by mouth daily before breakfast. 30 tablet 11   lisinopril  (ZESTRIL ) 20 MG tablet Take 20 mg by mouth daily.     metFORMIN (GLUCOPHAGE) 500 MG tablet Take 500 mg by mouth 2 (two) times daily with a meal.     Semaglutide , 1 MG/DOSE, (OZEMPIC , 1 MG/DOSE,) 4 MG/3ML SOPN Inject into the skin.     spironolactone  (ALDACTONE ) 25 MG tablet Take 0.5 tablets (12.5 mg total) by mouth daily. 45 tablet 3   torsemide  (DEMADEX ) 20 MG tablet Take 3 tablets (60 mg total) by mouth daily. 90 tablet 3   No current facility-administered medications for this visit.    Allergies  Allergen Reactions   Penicillins Hives    Other reaction(s): Unknown      Social History   Socioeconomic History  Marital status: Married    Spouse name: Not on file   Number of children: Not on file   Years of education: Not on file   Highest education level: Not on file  Occupational History   Not on file  Tobacco Use   Smoking status: Former    Current packs/day: 0.00    Types: Cigarettes    Quit date: 08/19/2006    Years since quitting: 17.7   Smokeless tobacco: Never  Vaping Use   Vaping status: Never Used  Substance and Sexual Activity   Alcohol use: Not Currently    Alcohol/week: 1.0 standard drink of alcohol    Types: 1 Cans of beer per week    Comment: denies alcohol since starting rifampin    Drug use: No    Sexual activity: Not on file  Other Topics Concern   Not on file  Social History Narrative   Not on file   Social Drivers of Health   Financial Resource Strain: Not on file  Food Insecurity: Not on file  Transportation Needs: Not on file  Physical Activity: Not on file  Stress: Not on file  Social Connections: Not on file  Intimate Partner Violence: Not on file      Family History  Problem Relation Age of Onset   Parkinson's disease Mother    Kidney Stones Father    Heart attack Maternal Uncle    Breast cancer Cousin       PHYSICAL EXAM:  General: Well appearing, obese male.  Cor: Difficult to assess JVD due to neck size. Regular rhythm, rate.  Lungs: clear Abdomen: soft, nontender, nondistended. Extremities: 1+ pitting edema bilateral lower legs Neuro:. Affect pleasant   ECG:not done   ASSESSMENT & PLAN:  1: Chronic heart failure with preserved ejection fraction- - suspect due to untreated sleep apnea - NYHA class II - euvolemic - weight 366 pounds from last visit here 1 month ago - scales provided at last visit - Echo 08/03/20: EF 60-65%, G1DD, normal RV. - updated echo to be scheduled - continue carvedilol  3.125mg  BID - continue jardiance  10mg  daily - continue lisinopril  20mg  daily - continue spironolactone  12.5mg  daily. Pill cutter provided to patient today - continue torsemide  60mg  daily - BMET today - BNP 02/25/24 was 26.2  2: HTN- - BP  - sees PCP at [facility] - BMET 04/05/24 reviewed: sodium 140, potassium 4.4, creatinine 1.25 & GFR >60 - BMET today  3: DM- - continue metformin 500mg  BID - continue ozempic  1mg  weekly - managed by PCP  4: Snoring- - daughter & patient both endorse that he snores - scored 7 on STOPBANG - order for sleep study sent in today  5: Latent TB- - [facility] - continues with rifampin  600mg  daily  6: Morbid obesity- - BMI 60.91 kg/m2 - referral placed today to  discuss bariatric surgery - continue ozempic . Consider titration      Ellouise DELENA Class, FNP 04/29/24

## 2024-04-30 ENCOUNTER — Ambulatory Visit: Payer: Self-pay | Admitting: Family

## 2024-04-30 ENCOUNTER — Telehealth: Payer: Self-pay | Admitting: Family

## 2024-04-30 ENCOUNTER — Encounter: Payer: Self-pay | Admitting: Family

## 2024-04-30 ENCOUNTER — Other Ambulatory Visit
Admission: RE | Admit: 2024-04-30 | Discharge: 2024-04-30 | Disposition: A | Discharge status: Home / Self Care | Source: Ambulatory Visit | Attending: Family | Admitting: Family

## 2024-04-30 ENCOUNTER — Ambulatory Visit (HOSPITAL_BASED_OUTPATIENT_CLINIC_OR_DEPARTMENT_OTHER): Admitting: Family

## 2024-04-30 VITALS — BP 123/81 | HR 69 | Wt 354.2 lb

## 2024-04-30 DIAGNOSIS — E119 Type 2 diabetes mellitus without complications: Secondary | ICD-10-CM

## 2024-04-30 DIAGNOSIS — I5032 Chronic diastolic (congestive) heart failure: Secondary | ICD-10-CM | POA: Diagnosis present

## 2024-04-30 DIAGNOSIS — I10 Essential (primary) hypertension: Secondary | ICD-10-CM | POA: Diagnosis not present

## 2024-04-30 DIAGNOSIS — G4733 Obstructive sleep apnea (adult) (pediatric): Secondary | ICD-10-CM

## 2024-04-30 DIAGNOSIS — Z227 Latent tuberculosis: Secondary | ICD-10-CM

## 2024-04-30 LAB — BASIC METABOLIC PANEL WITH GFR
Anion gap: 9 (ref 5–15)
BUN: 19 mg/dL (ref 6–20)
CO2: 32 mmol/L (ref 22–32)
Calcium: 8.3 mg/dL — ABNORMAL LOW (ref 8.9–10.3)
Chloride: 98 mmol/L (ref 98–111)
Creatinine, Ser: 1.2 mg/dL (ref 0.61–1.24)
GFR, Estimated: >60 mL/min (ref >60)
Glucose, Bld: 131 mg/dL — ABNORMAL HIGH (ref 70–99)
Potassium: 5 mmol/L (ref 3.5–5.1)
Sodium: 139 mmol/L (ref 135–145)

## 2024-04-30 MED ORDER — OZEMPIC (2 MG/DOSE) 8 MG/3ML ~~LOC~~ SOPN: 2.0000 mg | PEN_INJECTOR | SUBCUTANEOUS | 1 refills | Status: DC

## 2024-04-30 MED ORDER — OZEMPIC (1 MG/DOSE) 4 MG/3ML ~~LOC~~ SOPN: 2.0000 mg | PEN_INJECTOR | SUBCUTANEOUS | 1 refills | Status: DC

## 2024-04-30 NOTE — Patient Instructions (Signed)
 Medication Changes:  INCREASE Ozempic  2mg  weekly injection  Lab Work:  Go over to the [facility]ALL. Go pass the gift shop and have your blood work completed.  We will only call you if the results are abnormal or if the provider would like to make medication changes.  No news is good news.   Testing/Procedures:  Your physician has requested that you have an echocardiogram. Echocardiography is a painless test that uses sound waves to create images of your heart. It provides your doctor with information about the size and shape of your heart and how well your heart's chambers and valves are working. This procedure takes approximately one hour. There are no restrictions for this procedure. Please do NOT wear cologne, perfume, aftershave, or lotions (deodorant is allowed). Please arrive 15 minutes prior to your appointment time.  Please note: We ask at that you not bring children with you during ultrasound (echo/ vascular) testing. Due to room size and safety concerns, children are not allowed in the ultrasound rooms during exams. Our front office staff cannot provide observation of children in our lobby area while testing is being conducted. An adult accompanying a patient to their appointment will only be allowed in the ultrasound room at the discretion of the ultrasound technician under special circumstances. We apologize for any inconvenience.  SOMEONE WILL CALL YOU IN ORDER TO MAKE THIS APPOINTMENT.    Special Instructions // Education:  You have been urgently referred to [facility] Pulmonology. You're appointment is Monday 05/03/24 at 3:00pm  Follow-Up in: Please follow up with the Advanced Heart Failure Clinic in 1 month with Ellouise Class, FNP.   Thank you for choosing [facility] Advanced Heart Failure Clinic.    At the Advanced Heart Failure Clinic, you and your health needs are our priority. We have a designated team specialized in the treatment of Heart Failure. This Care  Team includes your primary Heart Failure Specialized Cardiologist (physician), Advanced Practice Providers (APPs- Physician Assistants and Nurse Practitioners), and Pharmacist who all work together to provide you with the care you need, when you need it.   You may see any of the following providers on your designated Care Team at your next follow up:  Dr. Toribio Fuel Dr. Ezra Shuck Dr. Ria Commander Dr. Morene Brownie Ellouise Class, FNP Jaun Bash, RPH-CPP  Please be sure to bring in all your medications bottles to every appointment.   Need to Contact Us :  If you have any questions or concerns before your next appointment please send us  a message through [place] or call our office at [phone].    TO LEAVE A MESSAGE FOR THE NURSE SELECT OPTION 2, PLEASE LEAVE A MESSAGE INCLUDING: YOUR NAME DATE OF BIRTH CALL BACK NUMBER REASON FOR CALL**this is important as we prioritize the call backs  YOU WILL RECEIVE A CALL BACK THE SAME DAY AS LONG AS YOU CALL BEFORE 4:00 PM

## 2024-05-03 ENCOUNTER — Ambulatory Visit (INDEPENDENT_AMBULATORY_CARE_PROVIDER_SITE_OTHER): Admitting: Sleep Medicine

## 2024-05-03 ENCOUNTER — Encounter: Payer: Self-pay | Admitting: Sleep Medicine

## 2024-05-03 VITALS — BP 130/80 | HR 71 | Temp 97.9°F | Ht 65.0 in | Wt 350.2 lb

## 2024-05-03 DIAGNOSIS — G4733 Obstructive sleep apnea (adult) (pediatric): Secondary | ICD-10-CM

## 2024-05-03 DIAGNOSIS — Z6841 Body Mass Index (BMI) 40.0 and over, adult: Secondary | ICD-10-CM

## 2024-05-03 DIAGNOSIS — Z87891 Personal history of nicotine dependence: Secondary | ICD-10-CM | POA: Diagnosis not present

## 2024-05-03 DIAGNOSIS — I10 Essential (primary) hypertension: Secondary | ICD-10-CM

## 2024-05-03 NOTE — Progress Notes (Unsigned)
 Name:Benjamin Moran MRN: [id number] DOB: 09-17-1970   CHIEF COMPLAINT:  EXCESSIVE DAYTIME SLEEPINESS   HISTORY OF PRESENT ILLNESS: [name] is a 53 y.o. w/ a h/o HTN, DMII, hyperlipidemia and morbid obesity who present for c/o loud snoring and excessive daytime sleepiness which has been present for several years. Reports nocturnal awakenings due to nocturia and has difficulty falling back to sleep. Reports a 30 lb weight gain over the last few years. Denies morning headaches, RLS symptoms, dream enactment, cataplexy, hypnagogic or hypnapompic hallucinations. Denies a family history of sleep apnea. Denies drowsy driving. Drinks coffee occasionally, occasional alcohol use, denies tobacco or illicit drug use.   The patient underwent PSG which revealed severe OSA (AHI 120, O2 60%), the patient was titrated to BIPAP therapy at 25/19 cm H2O with 2 lpm O2.   Bedtime 10-11 pm Sleep onset 30 mins Rise time 5 am   EPWORTH SLEEP SCORE 19    05/03/2024    2:00 PM  Results of the Epworth flowsheet  Sitting and reading 2  Watching TV 3  Sitting, inactive in a public place (e.g. a theatre or a meeting) 2  As a passenger in a car for an hour without a break 3  Lying down to rest in the afternoon when circumstances permit 3  Sitting and talking to someone 3  Sitting quietly after a lunch without alcohol 3  In a car, while stopped for a few minutes in traffic 0  Total score 19    PAST MEDICAL HISTORY :   has a past medical history of Borderline diabetic, Closed fracture of wrist (12/24/2013), Diabetes mellitus without complication (HCC), Hypertension, Renal disorder, TFCC (triangular fibrocartilage complex) tear (02/28/2014), and Wrist pain (01/07/2014).  has a past surgical history that includes Appendectomy; Hand surgery (Left); and Tonsilectomy, adenoidectomy, bilateral myringotomy and tubes. Prior to Admission medications   Medication Sig Start Date End Date Taking?  Authorizing Provider  acetaminophen  (TYLENOL ) 325 MG tablet Take 650 mg by mouth every 6 (six) hours as needed.   Yes [provider]  aspirin  81 MG chewable tablet Chew by mouth daily.   Yes [provider]  atorvastatin  (LIPITOR) 10 MG tablet Take 10 mg by mouth daily.   Yes [provider]  carvedilol  (COREG ) 3.125 MG tablet Take 3.125 mg by mouth 2 (two) times daily with a meal.   Yes [provider]  empagliflozin  (JARDIANCE ) 10 MG TABS tablet Take 1 tablet (10 mg total) by mouth daily before breakfast. 02/25/24  Yes [place] A, FNP  lisinopril  (ZESTRIL ) 10 MG tablet Take 10 mg by mouth daily. 04/27/24  Yes [provider]  metFORMIN (GLUCOPHAGE) 500 MG tablet Take 500 mg by mouth 2 (two) times daily with a meal. Patient taking differently: Take 500 mg by mouth daily with breakfast.   Yes [provider]  OZEMPIC , 0.25 OR 0.5 MG/DOSE, 2 MG/3ML SOPN  04/26/24  Yes [provider]  spironolactone  (ALDACTONE ) 25 MG tablet Take 0.5 tablets (12.5 mg total) by mouth daily. 03/29/24 06/27/24 Yes Hackney, Tina A, FNP  torsemide  (DEMADEX ) 20 MG tablet Take 3 tablets (60 mg total) by mouth daily. 03/29/24  Yes [place] A, FNP   Allergies  Allergen Reactions   Penicillins Hives    Other reaction(s): Unknown    FAMILY HISTORY:  family history includes Breast cancer in his cousin; Heart attack in his maternal uncle; Kidney Stones in his father; Parkinson's disease  in his mother. SOCIAL HISTORY:  reports that he quit smoking about 17 years ago. His smoking use included cigarettes. He has never used smokeless tobacco. He reports that he does not currently use alcohol after a past usage of about 1.0 standard drink of alcohol per week. He reports that he does not use drugs.   Review of Systems:  Gen:  Denies  fever, sweats, chills weight loss  HEENT: Denies blurred vision, double vision, ear pain, eye pain, hearing loss, nose bleeds,  sore throat Cardiac:  No dizziness, chest pain or heaviness, chest tightness,edema, No JVD Resp:   No cough, -sputum production, -shortness of breath,-wheezing, -hemoptysis,  Gi: Denies swallowing difficulty, stomach pain, nausea or vomiting, diarrhea, constipation, bowel incontinence Gu:  Denies bladder incontinence, burning urine Ext:   Denies Joint pain, stiffness or swelling Skin: Denies  skin rash, easy bruising or bleeding or hives Endoc:  Denies polyuria, polydipsia , polyphagia or weight change Psych:   Denies depression, insomnia or hallucinations  Other:  All other systems negative  VITAL SIGNS: BP 130/80   Pulse 71   Temp 97.9 F (36.6 C)   Ht 5' 5 (1.651 m)   Wt (!) 350 lb 3.2 oz (158.8 kg)   SpO2 90%   BMI 58.28 kg/m    Physical Examination:   General Appearance: No distress  EYES PERRLA, EOM intact.   NECK Supple, No JVD Pulmonary: normal breath sounds, No wheezing.  CardiovascularNormal S1,S2.  No m/r/g.   Abdomen: Benign, Soft, non-tender. Skin:   warm, no rashes, no ecchymosis  Extremities: normal, no cyanosis, clubbing. Neuro:without focal findings,  speech normal  PSYCHIATRIC: Mood, affect within normal limits.   ASSESSMENT AND PLAN  OSA Reviewed PSG results with patient. Starting on auto BIPAP therapy with 2 LPM O2. Discussed the consequences of untreated sleep apnea. Advised not to drive drowsy for safety of patient and others. Will follow up in 3 months.     HTN Stable, on current management. Following with PCP.   Morbid obesity Counseled patient on diet and lifestyle modification. Also referring patient to bariatric surgery for further evaluation and management.    Patient  satisfied with Plan of action and management. All questions answered  I spent a total of 61 minutes reviewing chart data, face-to-face evaluation with the patient, counseling and coordination of care as detailed above.    Isolde Skaff, M.D.  Sleep Medicine [place]  Pulmonary & Critical Care Medicine

## 2024-05-03 NOTE — Patient Instructions (Signed)

## 2024-05-06 NOTE — Progress Notes (Signed)
 Disregard. Pt's pulomonary provider d/c Ozempic  rx and providing a new tx plan per chart.

## 2024-05-21 ENCOUNTER — Ambulatory Visit: Payer: Self-pay | Admitting: Family

## 2024-05-21 ENCOUNTER — Other Ambulatory Visit
Admission: RE | Admit: 2024-05-21 | Discharge: 2024-05-21 | Disposition: A | Discharge status: Home / Self Care | Source: Ambulatory Visit | Attending: Family | Admitting: Family

## 2024-05-21 DIAGNOSIS — I5032 Chronic diastolic (congestive) heart failure: Secondary | ICD-10-CM | POA: Insufficient documentation

## 2024-05-21 LAB — BASIC METABOLIC PANEL WITH GFR
Anion gap: 14 (ref 5–15)
BUN: 22 mg/dL — ABNORMAL HIGH (ref 6–20)
CO2: 29 mmol/L (ref 22–32)
Calcium: 8.9 mg/dL (ref 8.9–10.3)
Chloride: 96 mmol/L — ABNORMAL LOW (ref 98–111)
Creatinine, Ser: 1.43 mg/dL — ABNORMAL HIGH (ref 0.61–1.24)
GFR, Estimated: 58 mL/min — ABNORMAL LOW (ref >60)
Glucose, Bld: 82 mg/dL (ref 70–99)
Potassium: 4.3 mmol/L (ref 3.5–5.1)
Sodium: 139 mmol/L (ref 135–145)

## 2024-06-09 ENCOUNTER — Encounter: Admitting: Family

## 2024-08-02 ENCOUNTER — Ambulatory Visit: Admitting: Sleep Medicine

## 2024-08-25 ENCOUNTER — Telehealth: Payer: Self-pay | Admitting: Sleep Medicine

## 2024-08-25 NOTE — Telephone Encounter (Signed)
 Dr. Jess we have received a message from [facility]. They ran into an issue with the order for this patient see below  I contacted Benjamin Moran' insurance and have identified the issue. On his order, it has listed that an Airsense 11 CPAP was ordered, but then the settings provided to go with it are for an Aircurve 11. Is it possible to have this fixed and the new order faxed to us  so I can submit the full BIPAP order to the insurance.  Best regards, Dena EMERSON Flock

## 2024-08-25 NOTE — Addendum Note (Signed)
 Addended by: Rebeccah Ivins J on: 08/25/2024 10:50 AM   Modules accepted: Orders
